# Patient Record
Sex: Female | Born: 1995 | Race: White | Hispanic: No | Marital: Married | Smoking: Current every day smoker
Health system: Southern US, Community
[De-identification: ages and names within clinical notes are randomized; demographics above are authoritative.]

## PROBLEM LIST (undated history)

## (undated) ENCOUNTER — Inpatient Hospital Stay (HOSPITAL_COMMUNITY): Payer: Self-pay

## (undated) DIAGNOSIS — M419 Scoliosis, unspecified: Secondary | ICD-10-CM

## (undated) DIAGNOSIS — F32A Depression, unspecified: Secondary | ICD-10-CM

## (undated) DIAGNOSIS — F419 Anxiety disorder, unspecified: Secondary | ICD-10-CM

## (undated) DIAGNOSIS — F329 Major depressive disorder, single episode, unspecified: Secondary | ICD-10-CM

## (undated) HISTORY — DX: Anxiety disorder, unspecified: F41.9

## (undated) HISTORY — DX: Depression, unspecified: F32.A

## (undated) HISTORY — PX: WISDOM TOOTH EXTRACTION: SHX21

## (undated) HISTORY — PX: TONSILLECTOMY: SUR1361

---
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
## 2015-07-25 ENCOUNTER — Encounter (HOSPITAL_BASED_OUTPATIENT_CLINIC_OR_DEPARTMENT_OTHER): Payer: Self-pay | Admitting: *Deleted

## 2015-07-25 ENCOUNTER — Emergency Department (HOSPITAL_BASED_OUTPATIENT_CLINIC_OR_DEPARTMENT_OTHER): Payer: Medicaid Other

## 2015-07-25 ENCOUNTER — Emergency Department (HOSPITAL_BASED_OUTPATIENT_CLINIC_OR_DEPARTMENT_OTHER)
Admission: EM | Admit: 2015-07-25 | Discharge: 2015-07-25 | Disposition: A | Discharge status: Home / Self Care | Payer: Medicaid Other | Attending: Emergency Medicine | Admitting: Emergency Medicine

## 2015-07-25 DIAGNOSIS — Z79899 Other long term (current) drug therapy: Secondary | ICD-10-CM | POA: Insufficient documentation

## 2015-07-25 DIAGNOSIS — R109 Unspecified abdominal pain: Secondary | ICD-10-CM

## 2015-07-25 DIAGNOSIS — T148XXA Other injury of unspecified body region, initial encounter: Secondary | ICD-10-CM

## 2015-07-25 DIAGNOSIS — O9A212 Injury, poisoning and certain other consequences of external causes complicating pregnancy, second trimester: Secondary | ICD-10-CM | POA: Insufficient documentation

## 2015-07-25 DIAGNOSIS — Z3A17 17 weeks gestation of pregnancy: Secondary | ICD-10-CM | POA: Insufficient documentation

## 2015-07-25 DIAGNOSIS — Y9389 Activity, other specified: Secondary | ICD-10-CM | POA: Diagnosis not present

## 2015-07-25 DIAGNOSIS — W548XXA Other contact with dog, initial encounter: Secondary | ICD-10-CM | POA: Diagnosis not present

## 2015-07-25 DIAGNOSIS — Z8739 Personal history of other diseases of the musculoskeletal system and connective tissue: Secondary | ICD-10-CM | POA: Insufficient documentation

## 2015-07-25 DIAGNOSIS — T148 Other injury of unspecified body region: Secondary | ICD-10-CM | POA: Diagnosis not present

## 2015-07-25 DIAGNOSIS — Y9289 Other specified places as the place of occurrence of the external cause: Secondary | ICD-10-CM | POA: Insufficient documentation

## 2015-07-25 DIAGNOSIS — S3991XA Unspecified injury of abdomen, initial encounter: Secondary | ICD-10-CM | POA: Diagnosis not present

## 2015-07-25 DIAGNOSIS — Y998 Other external cause status: Secondary | ICD-10-CM | POA: Insufficient documentation

## 2015-07-25 DIAGNOSIS — Z87891 Personal history of nicotine dependence: Secondary | ICD-10-CM | POA: Diagnosis not present

## 2015-07-25 DIAGNOSIS — Z3490 Encounter for supervision of normal pregnancy, unspecified, unspecified trimester: Secondary | ICD-10-CM

## 2015-07-25 HISTORY — DX: Scoliosis, unspecified: M41.9

## 2015-07-25 LAB — URINALYSIS, ROUTINE W REFLEX MICROSCOPIC
BILIRUBIN URINE: NEGATIVE
Glucose, UA: NEGATIVE mg/dL
HGB URINE DIPSTICK: NEGATIVE
KETONES UR: NEGATIVE mg/dL
Leukocytes, UA: NEGATIVE
Nitrite: NEGATIVE
PH: 7.5 (ref 5.0–8.0)
Protein, ur: NEGATIVE mg/dL
SPECIFIC GRAVITY, URINE: 1.012 (ref 1.005–1.030)
Urobilinogen, UA: 0.2 mg/dL (ref 0.0–1.0)

## 2015-07-25 LAB — COMPREHENSIVE METABOLIC PANEL WITH GFR
ALT: 12 U/L — ABNORMAL LOW (ref 14–54)
AST: 22 U/L (ref 15–41)
Albumin: 3.7 g/dL (ref 3.5–5.0)
Alkaline Phosphatase: 38 U/L (ref 38–126)
Anion gap: 4 — ABNORMAL LOW (ref 5–15)
BUN: 8 mg/dL (ref 6–20)
CO2: 24 mmol/L (ref 22–32)
Calcium: 8.8 mg/dL — ABNORMAL LOW (ref 8.9–10.3)
Chloride: 108 mmol/L (ref 101–111)
Creatinine, Ser: 0.58 mg/dL (ref 0.44–1.00)
GFR calc Af Amer: >60 mL/min
GFR calc non Af Amer: >60 mL/min
Glucose, Bld: 88 mg/dL (ref 65–99)
Potassium: 3.6 mmol/L (ref 3.5–5.1)
Sodium: 136 mmol/L (ref 135–145)
Total Bilirubin: 0.5 mg/dL (ref 0.3–1.2)
Total Protein: 6.2 g/dL — ABNORMAL LOW (ref 6.5–8.1)

## 2015-07-25 LAB — CBC WITH DIFFERENTIAL/PLATELET
Basophils Absolute: 0 K/uL (ref 0.0–0.1)
Basophils Relative: 0 %
Eosinophils Absolute: 0.1 K/uL (ref 0.0–0.7)
Eosinophils Relative: 1 %
HCT: 37.1 % (ref 36.0–46.0)
Hemoglobin: 12.8 g/dL (ref 12.0–15.0)
Lymphocytes Relative: 20 %
Lymphs Abs: 2 K/uL (ref 0.7–4.0)
MCH: 32.1 pg (ref 26.0–34.0)
MCHC: 34.5 g/dL (ref 30.0–36.0)
MCV: 93 fL (ref 78.0–100.0)
Monocytes Absolute: 0.6 K/uL (ref 0.1–1.0)
Monocytes Relative: 6 %
Neutro Abs: 7.3 K/uL (ref 1.7–7.7)
Neutrophils Relative %: 73 %
Platelets: 177 K/uL (ref 150–400)
RBC: 3.99 MIL/uL (ref 3.87–5.11)
RDW: 12.4 % (ref 11.5–15.5)
WBC: 9.8 K/uL (ref 4.0–10.5)

## 2015-07-25 LAB — ABO/RH: ABO/RH(D): A POS

## 2015-07-25 LAB — HCG, QUANTITATIVE, PREGNANCY: hCG, Beta Chain, Quant, S: 52343 m[IU]/mL — ABNORMAL HIGH

## 2015-07-25 NOTE — Discharge Instructions (Signed)
Before [facility] Before your baby arrives it is important to:  Have all of the supplies that you will need to care for your baby.  Know where to go if there is an emergency.  Discuss the baby's arrival with other family members. WHAT SUPPLIES WILL I NEED? It is recommended that you have the following supplies: Large Items  Crib.  Crib mattress.  Rear-facing infant car seat. If possible, have a trained professional check to make sure that it is installed correctly. Feeding  6-8 bottles that are 4-5 oz in size.  6-8 nipples.  Bottle brush.  Sterilizer, or a large pan or kettle with a lid.  A way to boil and cool water.  If you will be breastfeeding:  Breast pump.  Nipple cream.  Nursing bra.  Breast pads.  Breast shields.  If you will be formula feeding:  Formula.  Measuring cups.  Measuring spoons. Bathing  Mild baby soap and baby shampoo.  Petroleum jelly.  Soft cloth towel and washcloth.  Hooded towel.  Cotton balls.  Bath basin. Other Supplies  Rectal thermometer.  Bulb syringe.  Baby wipes or washcloths for diaper changes.  Diaper bag.  Changing pad.  Clothing, including one-piece outfits and pajamas.  Baby nail clippers.  Receiving blankets.  Mattress pad and sheets for the crib.  Night-light for the baby's room.  Baby monitor.  2 or 3 pacifiers.  Either 24-36 cloth diapers and waterproof diaper covers or a box of disposable diapers. You may need to use as many as 10-12 diapers per day. HOW DO I PREPARE FOR AN EMERGENCY? Prepare for an emergency by:  Knowing how to get to the nearest hospital.  Listing the phone numbers of your baby's health care providers near your home phone and in your cell phone. HOW DO I PREPARE MY FAMILY?  Decide how to handle visitors.  If you have other children:  Talk with them about the baby coming home. Ask them how they feel about it.  Read a book together about being a new big  brother or sister.  Find ways to let them help you prepare for the new baby.  Have someone ready to care for them while you are in the hospital.   This information is not intended to replace advice given to you by your health care provider. Make sure you discuss any questions you have with your health care provider.   Document Released: 09/01/2008 Document Revised: 02/03/2015 Document Reviewed: 08/27/2014 Elsevier Interactive Patient Education 2016 [place] for Pregnant Women While you are pregnant, your body will require additional nutrition to help support your growing baby. It is recommended that you consume:  150 additional calories each day during your first trimester.  300 additional calories each day during your second trimester.  300 additional calories each day during your third trimester. Eating a healthy, well-balanced diet is very important for your health and for your baby's health. You also have a higher need for some vitamins and minerals, such as folic acid, calcium, iron, and vitamin D. WHAT DO I NEED TO KNOW ABOUT EATING DURING PREGNANCY?  Do not try to lose weight or go on a diet during pregnancy.  Choose healthy, nutritious foods. Choose  of a sandwich with a glass of milk instead of a candy bar or a high-calorie sugar-sweetened beverage.  Limit your overall intake of foods that have "empty calories." These are foods that have little nutritional value, such as sweets, desserts,  candies, sugar-sweetened beverages, and fried foods.  Eat a variety of foods, especially fruits and vegetables.  Take a prenatal vitamin to help meet the additional needs during pregnancy, specifically for folic acid, iron, calcium, and vitamin D.  Remember to stay active. Ask your health care provider for exercise recommendations that are specific to you.  Practice good food safety and cleanliness, such as washing your hands before you eat and after you prepare raw  meat. This helps to prevent foodborne illnesses, such as listeriosis, that can be very dangerous for your baby. Ask your health care provider for more information about listeriosis. WHAT DOES 150 EXTRA CALORIES LOOK LIKE? Healthy options for an additional 150 calories each day could be any of the following:  Plain low-fat yogurt (6-8 oz) with  cup of berries.  1 apple with 2 teaspoons of peanut butter.  Cut-up vegetables with  cup of hummus.  Low-fat chocolate milk (8 oz or 1 cup).  1 string cheese with 1 medium orange.   of a peanut butter and jelly sandwich on whole-wheat bread (1 tsp of peanut butter). For 300 calories, you could eat two of those healthy options each day.  WHAT IS A HEALTHY AMOUNT OF WEIGHT TO GAIN? The recommended amount of weight for you to gain is based on your pre-pregnancy BMI. If your pre-pregnancy BMI was:  Less than 18 (underweight), you should gain 28-40 lb.  18-24.9 (normal), you should gain 25-35 lb.  25-29.9 (overweight), you should gain 15-25 lb.  Greater than 30 (obese), you should gain 11-20 lb. WHAT IF I AM HAVING TWINS OR MULTIPLES? Generally, pregnant women who will be having twins or multiples may need to increase their daily calories by 300-600 calories each day. The recommended range for total weight gain is 25-54 lb, depending on your pre-pregnancy BMI. Talk with your health care provider for specific guidance about additional nutritional needs, weight gain, and exercise during your pregnancy. WHAT FOODS CAN I EAT? Grains Any grains. Try to choose whole grains, such as whole-wheat bread, oatmeal, or brown rice. Vegetables Any vegetables. Try to eat a variety of colors and types of vegetables to get a full range of vitamins and minerals. Remember to wash your vegetables well before eating. Fruits Any fruits. Try to eat a variety of colors and types of fruit to get a full range of vitamins and minerals. Remember to wash your fruits well  before eating. Meats and Other Protein Sources Lean meats, including chicken, [place], fish, and lean cuts of beef, veal, or pork. Make sure that all meats are cooked to "well done." Tofu. Tempeh. Beans. Eggs. Peanut butter and other nut butters. Seafood, such as shrimp, crab, and lobster. If you choose fish, select types that are higher in omega-3 fatty acids, including salmon, herring, mussels, trout, sardines, and pollock. Make sure that all meats are cooked to food-safe temperatures. Dairy Pasteurized milk and milk alternatives. Pasteurized yogurt and pasteurized cheese. Cottage cheese. Sour cream. Beverages Water. Juices that contain 100% fruit juice or vegetable juice. Caffeine-free teas and decaffeinated coffee. Drinks that contain caffeine are okay to drink, but it is better to avoid caffeine. Keep your total caffeine intake to less than 200 mg each day (12 oz of coffee, tea, or soda) or as directed by your health care provider. Condiments Any pasteurized condiments. Sweets and Desserts Any sweets and desserts. Fats and Oils Any fats and oils. The items listed above may not be a complete list of recommended foods or beverages.  Contact your dietitian for more options. WHAT FOODS ARE NOT RECOMMENDED? Vegetables Unpasteurized (raw) vegetable juices. Fruits Unpasteurized (raw) fruit juices. Meats and Other Protein Sources Cured meats that have nitrates, such as bacon, salami, and hotdogs. Luncheon meats, bologna, or other deli meats (unless they are reheated until they are steaming hot). Refrigerated pate, meat spreads from a meat counter, smoked seafood that is found in the refrigerated section of a store. Raw fish, such as sushi or sashimi. High mercury content fish, such as tilefish, shark, swordfish, and king mackerel. Raw meats, such as tuna or beef tartare. Undercooked meats and poultry. Make sure that all meats are cooked to food-safe temperatures. Dairy Unpasteurized (raw) milk and  any foods that have raw milk in them. Soft cheeses, such as feta, queso blanco, queso fresco, Brie, Camembert cheeses, blue-veined cheeses, and Panela cheese (unless it is made with pasteurized milk, which must be stated on the label). Beverages Alcohol. Sugar-sweetened beverages, such as sodas, teas, or energy drinks. Condiments Homemade fermented foods and drinks, such as pickles, sauerkraut, or kombucha drinks. (Store-bought pasteurized versions of these are okay.) Other Salads that are made in the store, such as ham salad, chicken salad, egg salad, tuna salad, and seafood salad. The items listed above may not be a complete list of foods and beverages to avoid. Contact your dietitian for more information.   This information is not intended to replace advice given to you by your health care provider. Make sure you discuss any questions you have with your health care provider.   Document Released: 07/04/2014 Document Reviewed: 07/04/2014 Elsevier Interactive Patient Education 2016 [place] of Pregnancy The second trimester is from week 13 through week 28, months 4 through 6. The second trimester is often a time when you feel your best. Your body has also adjusted to being pregnant, and you begin to feel better physically. Usually, morning sickness has lessened or quit completely, you may have more energy, and you may have an increase in appetite. The second trimester is also a time when the fetus is growing rapidly. At the end of the sixth month, the fetus is about 9 inches long and weighs about 1 pounds. You will likely begin to feel the baby move (quickening) between 18 and 20 weeks of the pregnancy. BODY CHANGES Your body goes through many changes during pregnancy. The changes vary from woman to woman.   Your weight will continue to increase. You will notice your lower abdomen bulging out.  You may begin to get stretch marks on your hips, abdomen, and breasts.  You  may develop headaches that can be relieved by medicines approved by your health care provider.  You may urinate more often because the fetus is pressing on your bladder.  You may develop or continue to have heartburn as a result of your pregnancy.  You may develop constipation because certain hormones are causing the muscles that push waste through your intestines to slow down.  You may develop hemorrhoids or swollen, bulging veins (varicose veins).  You may have back pain because of the weight gain and pregnancy hormones relaxing your joints between the bones in your pelvis and as a result of a shift in weight and the muscles that support your balance.  Your breasts will continue to grow and be tender.  Your gums may bleed and may be sensitive to brushing and flossing.  Dark spots or blotches (chloasma, mask of pregnancy) may develop on your face. This will  likely fade after the baby is born.  A dark line from your belly button to the pubic area (linea nigra) may appear. This will likely fade after the baby is born.  You may have changes in your hair. These can include thickening of your hair, rapid growth, and changes in texture. Some women also have hair loss during or after pregnancy, or hair that feels dry or thin. Your hair will most likely return to normal after your baby is born. WHAT TO EXPECT AT YOUR PRENATAL VISITS During a routine prenatal visit:  You will be weighed to make sure you and the fetus are growing normally.  Your blood pressure will be taken.  Your abdomen will be measured to track your baby's growth.  The fetal heartbeat will be listened to.  Any test results from the previous visit will be discussed. Your health care provider may ask you:  How you are feeling.  If you are feeling the baby move.  If you have had any abnormal symptoms, such as leaking fluid, bleeding, severe headaches, or abdominal cramping.  If you are using any tobacco products,  including cigarettes, chewing tobacco, and electronic cigarettes.  If you have any questions. Other tests that may be performed during your second trimester include:  Blood tests that check for:  Low iron levels (anemia).  Gestational diabetes (between 24 and 28 weeks).  Rh antibodies.  Urine tests to check for infections, diabetes, or protein in the urine.  An ultrasound to confirm the proper growth and development of the baby.  An amniocentesis to check for possible genetic problems.  Fetal screens for spina bifida and Down syndrome.  HIV (human immunodeficiency virus) testing. Routine prenatal testing includes screening for HIV, unless you choose not to have this test. HOME CARE INSTRUCTIONS   Avoid all smoking, herbs, alcohol, and unprescribed drugs. These chemicals affect the formation and growth of the baby.  Do not use any tobacco products, including cigarettes, chewing tobacco, and electronic cigarettes. If you need help quitting, ask your health care provider. You may receive counseling support and other resources to help you quit.  Follow your health care provider's instructions regarding medicine use. There are medicines that are either safe or unsafe to take during pregnancy.  Exercise only as directed by your health care provider. Experiencing uterine cramps is a good sign to stop exercising.  Continue to eat regular, healthy meals.  Wear a good support bra for breast tenderness.  Do not use hot tubs, steam rooms, or saunas.  Wear your seat belt at all times when driving.  Avoid raw meat, uncooked cheese, cat litter boxes, and soil used by cats. These carry germs that can cause birth defects in the baby.  Take your prenatal vitamins.  Take 1500-2000 mg of calcium daily starting at the 20th week of pregnancy until you deliver your baby.  Try taking a stool softener (if your health care provider approves) if you develop constipation. Eat more high-fiber foods,  such as fresh vegetables or fruit and whole grains. Drink plenty of fluids to keep your urine clear or pale yellow.  Take warm sitz baths to soothe any pain or discomfort caused by hemorrhoids. Use hemorrhoid cream if your health care provider approves.  If you develop varicose veins, wear support hose. Elevate your feet for 15 minutes, 3-4 times a day. Limit salt in your diet.  Avoid heavy lifting, wear low heel shoes, and practice good posture.  Rest with your legs elevated if  you have leg cramps or low back pain.  Visit your dentist if you have not gone yet during your pregnancy. Use a soft toothbrush to brush your teeth and be gentle when you floss.  A sexual relationship may be continued unless your health care provider directs you otherwise.  Continue to go to all your prenatal visits as directed by your health care provider. SEEK MEDICAL CARE IF:   You have dizziness.  You have mild pelvic cramps, pelvic pressure, or nagging pain in the abdominal area.  You have persistent nausea, vomiting, or diarrhea.  You have a bad smelling vaginal discharge.  You have pain with urination. SEEK IMMEDIATE MEDICAL CARE IF:   You have a fever.  You are leaking fluid from your vagina.  You have spotting or bleeding from your vagina.  You have severe abdominal cramping or pain.  You have rapid weight gain or loss.  You have shortness of breath with chest pain.  You notice sudden or extreme swelling of your face, hands, ankles, feet, or legs.  You have not felt your baby move in over an hour.  You have severe headaches that do not go away with medicine.  You have vision changes.   This information is not intended to replace advice given to you by your health care provider. Make sure you discuss any questions you have with your health care provider.   Document Released: 09/13/2001 Document Revised: 10/10/2014 Document Reviewed: 11/20/2012 Elsevier Interactive Patient Education  2016 [employer].  Prenatal Care WHAT IS PRENATAL CARE?  Prenatal care is the process of caring for a pregnant woman before she gives birth. Prenatal care makes sure that she and her baby remain as healthy as possible throughout pregnancy. Prenatal care may be provided by a midwife, family practice health care provider, or a childbirth and pregnancy specialist (obstetrician). Prenatal care may include physical examinations, testing, treatments, and education on nutrition, lifestyle, and social support services. WHY IS PRENATAL CARE SO IMPORTANT?  Early and consistent prenatal care increases the chance that you and your baby will remain healthy throughout your pregnancy. This type of care also decreases a baby's risk of being born too early (prematurely), or being born smaller than expected (small for gestational age). Any underlying medical conditions you may have that could pose a risk during your pregnancy are discussed during prenatal care visits. You will also be monitored regularly for any new conditions that may arise during your pregnancy so they can be treated quickly and effectively. WHAT HAPPENS DURING PRENATAL CARE VISITS? Prenatal care visits may include the following: Discussion Tell your health care provider about any new signs or symptoms you have experienced since your last visit. These might include:  Nausea or vomiting.  Increased or decreased level of energy.  Difficulty sleeping.  Back or leg pain.  Weight changes.  Frequent urination.  Shortness of breath with physical activity.  Changes in your skin, such as the development of a rash or itchiness.  Vaginal discharge or bleeding.  Feelings of excitement or nervousness.  Changes in your baby's movements. You may want to write down any questions or topics you want to discuss with your health care provider and bring them with you to your appointment. Examination During your first prenatal care visit, you will  likely have a complete physical exam. Your health care provider will often examine your vagina, cervix, and the position of your uterus, as well as check your heart, lungs, and other body systems. As  your pregnancy progresses, your health care provider will measure the size of your uterus and your baby's position inside your uterus. He or she may also examine you for early signs of labor. Your prenatal visits may also include checking your blood pressure and, after about 10-12 weeks of pregnancy, listening to your baby's heartbeat. Testing Regular testing often includes:  Urinalysis. This checks your urine for glucose, protein, or signs of infection.  Blood count. This checks the levels of white and red blood cells in your body.  Tests for sexually transmitted infections (STIs). Testing for STIs at the beginning of pregnancy is routinely done and is required in many states.  Antibody testing. You will be checked to see if you are immune to certain illnesses, such as rubella, that can affect a developing fetus.  Glucose screen. Around 24-28 weeks of pregnancy, your blood glucose level will be checked for signs of gestational diabetes. Follow-up tests may be recommended.  Group B strep. This is a bacteria that is commonly found inside a woman's vagina. This test will inform your health care provider if you need an antibiotic to reduce the amount of this bacteria in your body prior to labor and childbirth.  Ultrasound. Many pregnant women undergo an ultrasound screening around 18-20 weeks of pregnancy to evaluate the health of the fetus and check for any developmental abnormalities.  HIV (human immunodeficiency virus) testing. Early in your pregnancy, you will be screened for HIV. If you are at high risk for HIV, this test may be repeated during your third trimester of pregnancy. You may be offered other testing based on your age, personal or family medical history, or other factors.  HOW OFTEN  SHOULD I PLAN TO SEE MY HEALTH CARE PROVIDER FOR PRENATAL CARE? Your prenatal care check-up schedule depends on any medical conditions you have before, or develop during, your pregnancy. If you do not have any underlying medical conditions, you will likely be seen for checkups:  Monthly, during the first 6 months of pregnancy.  Twice a month during months 7 and 8 of pregnancy.  Weekly starting in the 9th month of pregnancy and until delivery. If you develop signs of early labor or other concerning signs or symptoms, you may need to see your health care provider more often. Ask your health care provider what prenatal care schedule is best for you. WHAT CAN I DO TO KEEP MYSELF AND MY BABY AS HEALTHY AS POSSIBLE DURING MY PREGNANCY?  Take a prenatal vitamin containing 400 micrograms (0.4 mg) of folic acid every day. Your health care provider may also ask you to take additional vitamins such as iodine, vitamin D, iron, copper, and zinc.  Take 1500-2000 mg of calcium daily starting at your 20th week of pregnancy until you deliver your baby.  Make sure you are up to date on your vaccinations. Unless directed otherwise by your health care provider:  You should receive a tetanus, diphtheria, and pertussis (Tdap) vaccination between the 27th and 36th week of your pregnancy, regardless of when your last Tdap immunization occurred. This helps protect your baby from whooping cough (pertussis) after he or she is born.  You should receive an annual inactivated influenza vaccine (IIV) to help protect you and your baby from influenza. This can be done at any point during your pregnancy.  Eat a well-rounded diet that includes:  Fresh fruits and vegetables.  Lean proteins.  Calcium-rich foods such as milk, yogurt, hard cheeses, and dark, leafy greens.  Whole grain  breads.  Do noteat seafood high in mercury, including:  Swordfish.  Tilefish.  Shark.  King mackerel.  More than 6 oz tuna per  week.  Do not eat:  Raw or undercooked meats or eggs.  Unpasteurized foods, such as soft cheeses (brie, blue, or feta), juices, and milks.  Lunch meats.  Hot dogs that have not been heated until they are steaming.  Drink enough water to keep your urine clear or pale yellow. For many women, this may be 10 or more 8 oz glasses of water each day. Keeping yourself hydrated helps deliver nutrients to your baby and may prevent the start of pre-term uterine contractions.  Do not use any tobacco products including cigarettes, chewing tobacco, or electronic cigarettes. If you need help quitting, ask your health care provider.  Do not drink beverages containing alcohol. No safe level of alcohol consumption during pregnancy has been determined.  Do not use any illegal drugs. These can harm your developing baby or cause a miscarriage.  Ask your health care provider or pharmacist before taking any prescription or over-the-counter medicines, herbs, or supplements.  Limit your caffeine intake to no more than 200 mg per day.  Exercise. Unless told otherwise by your health care provider, try to get 30 minutes of moderate exercise most days of the week. Do not  do high-impact activities, contact sports, or activities with a high risk of falling, such as horseback riding or downhill skiing.  Get plenty of rest.  Avoid anything that raises your body temperature, such as hot tubs and saunas.  If you own a cat, do not empty its litter box. Bacteria contained in cat feces can cause an infection called toxoplasmosis. This can result in serious harm to the fetus.  Stay away from chemicals such as insecticides, lead, mercury, and cleaning or paint products that contain solvents.  Do not have any X-rays taken unless medically necessary.  Take a childbirth and breastfeeding preparation class. Ask your health care provider if you need a referral or recommendation.   This information is not intended to  replace advice given to you by your health care provider. Make sure you discuss any questions you have with your health care provider.  FOLLOW UP WITH OBGYN AS SOON AS POSSIBLE FOR PRENATAL CARE AND EVALUATION. AVOID USE OF PHENERGAN IN PREGNANCY. READ ABOVE INFORMATION FOR PRENATAL EDUCATION. RETURN TO THE EMERGENCY DEPARTMENT IF YOU EXPERIENCE WORSENING OF YOUR SYMPTOMS, VAGINAL BLEEDING, WORSENING PAIN, FEVER, CHEST PAIN, SHORTNESS OF BREATH.

## 2015-07-25 NOTE — ED Notes (Addendum)
Patient c/o L side abd pain since their dog jumped on her on Thursday, no bleeding. Pregnant, she took a pregnancy test July 27 and it was positive

## 2015-07-25 NOTE — ED Provider Notes (Signed)
CSN: [id number]     Arrival date & time 07/25/15  1108 History   First MD Initiated Contact with Patient 07/25/15 1210     Chief Complaint  Patient presents with  . Abdominal Pain     (Consider location/radiation/quality/duration/timing/severity/associated sxs/prior Treatment) HPI   Emily Villanueva is a 19 y.o F G1P0 who presents to the emergency department today complaining of abdominal pain. Patient states that her 40 pound dog jumped on her stomach 3 days ago and she has had pain in left side of her abdomen ever since. Pain is 5/10. No aggravating or alleviating factors. Pain is constant. Patient states that she had a positive pregnancy test on July 27. She has not seen OB/GYN or had any prenatal care. Denies vomiting, vaginal discharge, dysuria, hematuria, vaginal bleeding. Patient is having regular bowel movements. No fever.  Past Medical History  Diagnosis Date  . Scoliosis    Past Surgical History  Procedure Laterality Date  . Tonsillectomy     No family history on file. Social History  Substance Use Topics  . Smoking status: Former Research scientist (life sciences)  . Smokeless tobacco: None  . Alcohol Use: No   OB History    No data available     Review of Systems  All other systems reviewed and are negative.     Allergies  Review of patient's allergies indicates no known allergies.  Home Medications   Prior to Admission medications   Medication Sig Start Date End Date Taking? Authorizing Provider  Prenatal Vit-Fe Fumarate-FA (PRENATAL MULTIVITAMIN) TABS tablet Take 1 tablet by mouth daily at 12 noon.   Yes Historical Provider, MD   BP 123/68 mmHg  Pulse 79  Temp(Src) 98.4 F (36.9 C) (Oral)  Resp 18  Ht 5\' 10"  (1.778 m)  Wt 180 lb 11.2 oz (81.965 kg)  BMI 25.93 kg/m2  SpO2 100%  LMP 04/06/2015 Physical Exam  Constitutional: She is oriented to person, place, and time. She appears well-developed and well-nourished. No distress.  HENT:  Head: Normocephalic and atraumatic.   Mouth/Throat: No oropharyngeal exudate.  Eyes: Conjunctivae and EOM are normal. Pupils are equal, round, and reactive to light. Right eye exhibits no discharge. Left eye exhibits no discharge. No scleral icterus.  Neck: Normal range of motion. Neck supple.  Cardiovascular: Normal rate, regular rhythm, normal heart sounds and intact distal pulses.  Exam reveals no gallop and no friction rub.   No murmur heard. Pulmonary/Chest: Effort normal and breath sounds normal. No respiratory distress. She has no wheezes. She has no rales. She exhibits no tenderness.  Abdominal: Soft. Bowel sounds are normal. She exhibits no distension and no mass. There is tenderness ( Mild tenderness to palpation of left upper quadrant.). There is no guarding.  No ecchymosis or edema.  Genitourinary: No vaginal discharge found.  Musculoskeletal: Normal range of motion. She exhibits no edema.  Lymphadenopathy:    She has no cervical adenopathy.  Neurological: She is alert and oriented to person, place, and time. No cranial nerve deficit.  Strength 5/5 throughout. No sensory deficits.  No gait abnormality.  Skin: Skin is warm and dry. No rash noted. She is not diaphoretic. No erythema. No pallor.  Psychiatric: She has a normal mood and affect. Her behavior is normal.  Nursing note and vitals reviewed.   ED Course  Procedures (including critical care time) Labs Review Labs Reviewed  COMPREHENSIVE METABOLIC PANEL - Abnormal; Notable for the following:    Calcium 8.8 (*)    Total Protein 6.2 (*)  ALT 12 (*)    Anion gap 4 (*)    All other components within normal limits  HCG, QUANTITATIVE, PREGNANCY - Abnormal; Notable for the following:    hCG, Beta Chain, Quant, [place] [address] (*)    All other components within normal limits  URINALYSIS, ROUTINE W REFLEX MICROSCOPIC (NOT AT [facility])  CBC WITH DIFFERENTIAL/PLATELET  ABO/RH    Imaging Review US Ob Limited  07/25/2015  CLINICAL DATA:  Dog jumped forcefully onto  abdomen 3 days ago, waxing and waning LEFT periumbilical pain since, former smoker, smoked during first 3 weeks of pregnancy ; estimated gestational age of [redacted] weeks 0 days by LMP EXAM: LIMITED OBSTETRIC ULTRASOUND FINDINGS: Number of Fetuses: 1 Heart Rate:  162 bpm Movement: Yes Presentation: Variable Placental Location: Fundal and RIGHT lateral Previa: No Amniotic Fluid (Subjective):  Within normal limits. BPD:  3.71 cm     17 w 2 d       [place] EGA:  12/31/2015 MATERNAL FINDINGS: Cervix:  Appears closed. Uterus/Adnexae:  No abnormality visualized. IMPRESSION: Single live intrauterine gestation as above. No acute abnormalities identified. This exam is performed on an emergent basis and does not comprehensively evaluate fetal size, dating, or anatomy; follow-up complete non emergent OB US recommended as patient indicates this has not been performed. Electronically Signed   By: Lavonia Dana M.D.   On: 07/25/2015 13:28   I have personally reviewed and evaluated these images and lab results as part of my medical decision-making.   EKG Interpretation None      MDM   Final diagnoses:  Contusion  Pregnancy    Patient presents with left-sided abdominal pain after her large dog jumped on her 3 days ago. Pain is improving. Patient is also pregnant. Patient has not had any prenatal care. LMP Was end of July. No vaginal bleeding or vaginal discharge. No dysuria. This is patient's first pregnancy.   US reveals single living IUP. All other labs within normal limits. Abdominal exam otherwise benign. Afebrile, in NAD. Do not suspect other acute abdominal process. Pt does not want STD testing at this time. Pt with one sexual partner who accompanies her to the visit. UA neg for infection. Pt is A pos. Does not need RhoGam. Hcg level appropriate for gestational age.   Recommend URGENT follow up with OBGYN as pt is [redacted] weeks pregnant and has not had any prenatal care. Discussed the importance of this with the patient who  is agreeable. Will discharge home with OBGYN referral. Pt given STRICT return precautions.       Dondra Spry [place], PA-C 07/26/15 1287  Serita Grit, MD 07/26/15 [phone]

## 2015-08-05 ENCOUNTER — Ambulatory Visit (INDEPENDENT_AMBULATORY_CARE_PROVIDER_SITE_OTHER): Payer: Medicaid Other | Admitting: Family Medicine

## 2015-08-05 ENCOUNTER — Encounter: Payer: Self-pay | Admitting: Family Medicine

## 2015-08-05 ENCOUNTER — Other Ambulatory Visit (HOSPITAL_COMMUNITY)
Admission: RE | Admit: 2015-08-05 | Discharge: 2015-08-05 | Disposition: A | Discharge status: Home / Self Care | Payer: Medicaid Other | Source: Ambulatory Visit | Attending: Family Medicine | Admitting: Family Medicine

## 2015-08-05 VITALS — BP 104/56 | HR 87 | Wt 181.0 lb

## 2015-08-05 DIAGNOSIS — Z3402 Encounter for supervision of normal first pregnancy, second trimester: Secondary | ICD-10-CM

## 2015-08-05 DIAGNOSIS — Z113 Encounter for screening for infections with a predominantly sexual mode of transmission: Secondary | ICD-10-CM | POA: Insufficient documentation

## 2015-08-05 DIAGNOSIS — Z3492 Encounter for supervision of normal pregnancy, unspecified, second trimester: Secondary | ICD-10-CM

## 2015-08-05 DIAGNOSIS — Z3400 Encounter for supervision of normal first pregnancy, unspecified trimester: Secondary | ICD-10-CM | POA: Insufficient documentation

## 2015-08-05 DIAGNOSIS — B360 Pityriasis versicolor: Secondary | ICD-10-CM

## 2015-08-05 LAB — OB RESULTS CONSOLE GC/CHLAMYDIA: GC PROBE AMP, GENITAL: NEGATIVE

## 2015-08-05 NOTE — Progress Notes (Signed)
Patient late to prenatal care. Unsure of LMP but did have some spotting 04-06-15 to 04-08-15. Bedside ultrasound done and fetal heart rate 160. Femur length done and 2.39cm giving gestational age 20.1 weeks.

## 2015-08-05 NOTE — Patient Instructions (Addendum)
Tinea Versicolor Tinea versicolor is a common fungal infection of the skin. It causes a rash that appears as light or dark patches on the skin. The rash most often occurs on the chest, back, neck, or upper arms. This condition is more common during warm weather. Other than affecting how your skin looks, tinea versicolor usually does not cause other problems. In most cases, the infection goes away in a few weeks with treatment. It may take a few months for the patches on your skin to clear up. CAUSES Tinea versicolor occurs when a type of fungus that is normally present on the skin starts to overgrow. This fungus is a kind of yeast. The exact cause of the overgrowth is not known. This condition cannot be passed from one person to another (noncontagious). RISK FACTORS This condition is more likely to develop when certain factors are present, such as:  Heat and humidity.  Sweating too much.  Hormone changes.  Oily skin.  A weak defense (immune) system. SYMPTOMS Symptoms of this condition may include:  A rash on your skin that is made up of light or dark patches. The rash may have:  Patches of tan or pink spots on light skin.  Patches of white or brown spots on dark skin.  Patches of skin that do not tan.  Well-marked edges.  Scales on the discolored areas.  Mild itching. DIAGNOSIS A health care provider can usually diagnose this condition by looking at your skin. During the exam, he or she may use ultraviolet light to help determine the extent of the infection. In some cases, a skin sample may be taken by scraping the rash. This sample will be viewed under a microscope to check for yeast overgrowth. TREATMENT Treatment for this condition may include:  Dandruff shampoo that is applied to the affected skin during showers or bathing.  (Selsun Blue Shampoo works well)  Over-the-counter medicated skin cream, lotion, or soaps.  Prescription antifungal medicine in the form of skin  cream or pills.  Medicine to help reduce itching. HOME CARE INSTRUCTIONS  Take medicines only as directed by your health care provider.  Apply dandruff shampoo to the affected area if told to do so by your health care provider. You may be instructed to scrub the affected skin for several minutes each day.  Do not scratch the affected area of skin.  Avoid hot and humid conditions.  Do not use tanning booths.  Try to avoid sweating a lot. SEEK MEDICAL CARE IF:  Your symptoms get worse.  You have a fever.  You have redness, swelling, or pain at the site of your rash.  You have fluid, blood, or pus coming from your rash.  Your rash returns after treatment.   This information is not intended to replace advice given to you by your health care provider. Make sure you discuss any questions you have with your health care provider.   Document Released: 09/16/2000 Document Revised: 10/10/2014 Document Reviewed: 07/01/2014 Elsevier Interactive Patient Education 2016 Mark of Pregnancy The second trimester is from week 13 through week 28, months 4 through 6. The second trimester is often a time when you feel your best. Your body has also adjusted to being pregnant, and you begin to feel better physically. Usually, morning sickness has lessened or quit completely, you may have more energy, and you may have an increase in appetite. The second trimester is also a time when the fetus is growing rapidly. At  the end of the sixth month, the fetus is about 9 inches long and weighs about 1 pounds. You will likely begin to feel the baby move (quickening) between 18 and 20 weeks of the pregnancy. BODY CHANGES Your body goes through many changes during pregnancy. The changes vary from woman to woman.   Your weight will continue to increase. You will notice your lower abdomen bulging out.  You may begin to get stretch marks on your hips, abdomen, and breasts.  You may  develop headaches that can be relieved by medicines approved by your health care provider.  You may urinate more often because the fetus is pressing on your bladder.  You may develop or continue to have heartburn as a result of your pregnancy.  You may develop constipation because certain hormones are causing the muscles that push waste through your intestines to slow down.  You may develop hemorrhoids or swollen, bulging veins (varicose veins).  You may have back pain because of the weight gain and pregnancy hormones relaxing your joints between the bones in your pelvis and as a result of a shift in weight and the muscles that support your balance.  Your breasts will continue to grow and be tender.  Your gums may bleed and may be sensitive to brushing and flossing.  Dark spots or blotches (chloasma, mask of pregnancy) may develop on your face. This will likely fade after the baby is born.  A dark line from your belly button to the pubic area (linea nigra) may appear. This will likely fade after the baby is born.  You may have changes in your hair. These can include thickening of your hair, rapid growth, and changes in texture. Some women also have hair loss during or after pregnancy, or hair that feels dry or thin. Your hair will most likely return to normal after your baby is born. WHAT TO EXPECT AT YOUR PRENATAL VISITS During a routine prenatal visit:  You will be weighed to make sure you and the fetus are growing normally.  Your blood pressure will be taken.  Your abdomen will be measured to track your baby's growth.  The fetal heartbeat will be listened to.  Any test results from the previous visit will be discussed. Your health care provider may ask you:  How you are feeling.  If you are feeling the baby move.  If you have had any abnormal symptoms, such as leaking fluid, bleeding, severe headaches, or abdominal cramping.  If you are using any tobacco products,  including cigarettes, chewing tobacco, and electronic cigarettes.  If you have any questions. Other tests that may be performed during your second trimester include:  Blood tests that check for:  Low iron levels (anemia).  Gestational diabetes (between 24 and 28 weeks).  Rh antibodies.  Urine tests to check for infections, diabetes, or protein in the urine.  An ultrasound to confirm the proper growth and development of the baby.  An amniocentesis to check for possible genetic problems.  Fetal screens for spina bifida and Down syndrome.  HIV (human immunodeficiency virus) testing. Routine prenatal testing includes screening for HIV, unless you choose not to have this test. HOME CARE INSTRUCTIONS   Avoid all smoking, herbs, alcohol, and unprescribed drugs. These chemicals affect the formation and growth of the baby.  Do not use any tobacco products, including cigarettes, chewing tobacco, and electronic cigarettes. If you need help quitting, ask your health care provider. You may receive counseling support and other resources  to help you quit.  Follow your health care provider's instructions regarding medicine use. There are medicines that are either safe or unsafe to take during pregnancy.  Exercise only as directed by your health care provider. Experiencing uterine cramps is a good sign to stop exercising.  Continue to eat regular, healthy meals.  Wear a good support bra for breast tenderness.  Do not use hot tubs, steam rooms, or saunas.  Wear your seat belt at all times when driving.  Avoid raw meat, uncooked cheese, cat litter boxes, and soil used by cats. These carry germs that can cause birth defects in the baby.  Take your prenatal vitamins.  Take 1500-2000 mg of calcium daily starting at the 20th week of pregnancy until you deliver your baby.  Try taking a stool softener (if your health care provider approves) if you develop constipation. Eat more high-fiber foods,  such as fresh vegetables or fruit and whole grains. Drink plenty of fluids to keep your urine clear or pale yellow.  Take warm sitz baths to soothe any pain or discomfort caused by hemorrhoids. Use hemorrhoid cream if your health care provider approves.  If you develop varicose veins, wear support hose. Elevate your feet for 15 minutes, 3-4 times a day. Limit salt in your diet.  Avoid heavy lifting, wear low heel shoes, and practice good posture.  Rest with your legs elevated if you have leg cramps or low back pain.  Visit your dentist if you have not gone yet during your pregnancy. Use a soft toothbrush to brush your teeth and be gentle when you floss.  A sexual relationship may be continued unless your health care provider directs you otherwise.  Continue to go to all your prenatal visits as directed by your health care provider. SEEK MEDICAL CARE IF:   You have dizziness.  You have mild pelvic cramps, pelvic pressure, or nagging pain in the abdominal area.  You have persistent nausea, vomiting, or diarrhea.  You have a bad smelling vaginal discharge.  You have pain with urination. SEEK IMMEDIATE MEDICAL CARE IF:   You have a fever.  You are leaking fluid from your vagina.  You have spotting or bleeding from your vagina.  You have severe abdominal cramping or pain.  You have rapid weight gain or loss.  You have shortness of breath with chest pain.  You notice sudden or extreme swelling of your face, hands, ankles, feet, or legs.  You have not felt your baby move in over an hour.  You have severe headaches that do not go away with medicine.  You have vision changes.   This information is not intended to replace advice given to you by your health care provider. Make sure you discuss any questions you have with your health care provider.   Document Released: 09/13/2001 Document Revised: 10/10/2014 Document Reviewed: 11/20/2012 Elsevier Interactive Patient Education  [employer].

## 2015-08-05 NOTE — Addendum Note (Signed)
Addended by: Phill Myron on: 08/05/2015 04:39 PM   Modules accepted: Orders

## 2015-08-05 NOTE — Progress Notes (Signed)
   Subjective:    Emily Villanueva is a G1P0 [redacted]w[redacted]d being seen today for her first obstetrical visit.  Her obstetrical history is insignificant.  FOB is involved.  This was a planned pregnancy. Patient does intend to breast feed. Pregnancy history fully reviewed.  Patient reports no complaints.  Filed Vitals:   08/05/15 1522  BP: 104/56  Pulse: 87  Weight: 181 lb (82.101 kg)    HISTORY: OB History  Gravida Para Term Preterm AB SAB TAB Ectopic Multiple Living  1             # Outcome Date GA Lbr Len/2nd Weight Sex Delivery Anes PTL Lv  1 Current              Past Medical History  Diagnosis Date  . Scoliosis   . Scoliosis    Past Surgical History  Procedure Laterality Date  . Tonsillectomy     Family History  Problem Relation Age of Onset  . Stroke Mother      Exam    Uterus:     Pelvic Exam:   System:     Skin: Tinea versicolor on chest, shoulders, neck    Neurologic: gait normal; reflexes normal and symmetric   Extremities: normal strength, tone, and muscle mass   HEENT PERRLA and extra ocular movement intact   Mouth/Teeth mucous membranes moist, pharynx normal without lesions   Neck supple and no masses   Cardiovascular: regular rate and rhythm, no murmurs or gallops   Respiratory:  appears well, vitals normal, no respiratory distress, acyanotic, normal RR, ear and throat exam is normal, neck free of mass or lymphadenopathy, chest clear, no wheezing, crepitations, rhonchi, normal symmetric air entry   Abdomen: soft, non-tender; bowel sounds normal; no masses,  no organomegaly          Assessment:    Pregnancy: G1P0 Patient Active Problem List   Diagnosis Date Noted  . Supervision of normal first pregnancy, antepartum 08/05/2015        Plan:     Initial labs drawn. Prenatal vitamins. Problem list reviewed and updated. Genetic Screening discussed Quad Screen: ordered.  Ultrasound discussed; fetal survey: ordered.  Follow up in 4 weeks. 50% of 30  min visit spent on counseling and coordination of care.     Loma Boston JEHIEL 08/05/2015

## 2015-08-06 LAB — AFP, QUAD SCREEN
AFP: 57.7 ng/mL
Age Alone: 1:1170 {titer}
CURR GEST AGE: 17.2 wks.days
Down Syndrome Scr Risk Est: <1:38500 {titer}
HCG, Total: 60.66 IU/mL
INH: 244.5 pg/mL
Interpretation-AFP: NEGATIVE
MOM FOR HCG: 2.19
MoM for AFP: 1.61
MoM for INH: 1.68
Open Spina bifida: NEGATIVE
Osb Risk: 1:2040 {titer}
TRI 18 SCR RISK EST: NEGATIVE
UE3 VALUE: 1.64 ng/mL
uE3 Mom: 1.59

## 2015-08-06 LAB — OBSTETRIC PANEL
Antibody Screen: NEGATIVE
BASOS PCT: 0 % (ref 0–1)
Basophils Absolute: 0 10*3/uL (ref 0.0–0.1)
Eosinophils Absolute: 0.1 10*3/uL (ref 0.0–0.7)
Eosinophils Relative: 1 % (ref 0–5)
HEMATOCRIT: 38.8 % (ref 36.0–46.0)
Hemoglobin: 13.6 g/dL (ref 12.0–15.0)
Hepatitis B Surface Ag: NEGATIVE
LYMPHS ABS: 1.7 10*3/uL (ref 0.7–4.0)
LYMPHS PCT: 18 % (ref 12–46)
MCH: 31.9 pg (ref 26.0–34.0)
MCHC: 35.1 g/dL (ref 30.0–36.0)
MCV: 91.1 fL (ref 78.0–100.0)
MONOS PCT: 6 % (ref 3–12)
MPV: 10.8 fL (ref 8.6–12.4)
Monocytes Absolute: 0.6 10*3/uL (ref 0.1–1.0)
NEUTROS ABS: 7.3 10*3/uL (ref 1.7–7.7)
NEUTROS PCT: 75 % (ref 43–77)
PLATELETS: 197 10*3/uL (ref 150–400)
RBC: 4.26 MIL/uL (ref 3.87–5.11)
RDW: 12.9 % (ref 11.5–15.5)
RUBELLA: 4.37 {index} — AB (ref <0.90)
Rh Type: POSITIVE
WBC: 9.7 10*3/uL (ref 4.0–10.5)

## 2015-08-06 LAB — HIV ANTIBODY (ROUTINE TESTING W REFLEX): HIV 1&2 Ab, 4th Generation: NONREACTIVE

## 2015-08-07 LAB — GC/CHLAMYDIA PROBE AMP (~~LOC~~) NOT AT ARMC
Chlamydia: NEGATIVE
NEISSERIA GONORRHEA: NEGATIVE

## 2015-08-07 LAB — CULTURE, URINE COMPREHENSIVE
COLONY COUNT: NO GROWTH
ORGANISM ID, BACTERIA: NO GROWTH

## 2015-08-11 LAB — CYSTIC FIBROSIS DIAGNOSTIC STUDY

## 2015-08-17 ENCOUNTER — Ambulatory Visit (HOSPITAL_COMMUNITY)
Admission: RE | Admit: 2015-08-17 | Discharge: 2015-08-17 | Disposition: A | Discharge status: Home / Self Care | Payer: Medicaid Other | Source: Ambulatory Visit | Attending: Family Medicine | Admitting: Family Medicine

## 2015-08-17 DIAGNOSIS — Z3402 Encounter for supervision of normal first pregnancy, second trimester: Secondary | ICD-10-CM | POA: Insufficient documentation

## 2015-09-02 ENCOUNTER — Ambulatory Visit (INDEPENDENT_AMBULATORY_CARE_PROVIDER_SITE_OTHER): Payer: Medicaid Other | Admitting: Family Medicine

## 2015-09-02 VITALS — BP 119/68 | HR 108 | Wt 188.0 lb

## 2015-09-02 DIAGNOSIS — Z3402 Encounter for supervision of normal first pregnancy, second trimester: Secondary | ICD-10-CM

## 2015-09-02 NOTE — Progress Notes (Signed)
Subjective:  Emily Villanueva is a 19 y.o. G1P0 at [redacted]w[redacted]d being seen today for ongoing prenatal care.  She is currently monitored for the following issues for this low-risk pregnancy and has Supervision of normal first pregnancy, antepartum on her problem list.  Patient relays that her mother has paralysis and speech deficit after birth, which progressed after each pregnancy  Patient reports no complaints.  Contractions: Not present. Vag. Bleeding: None.  Movement: Present. Denies leaking of fluid.   The following portions of the patient's history were reviewed and updated as appropriate: allergies, current medications, past family history, past medical history, past social history, past surgical history and problem list. Problem list updated.  Objective:   Filed Vitals:   09/02/15 1501  BP: 119/68  Pulse: 108  Weight: 188 lb (85.276 kg)    Fetal Status: Fetal Heart Rate (bpm): 151   Movement: Present     General:  Alert, oriented and cooperative. Patient is in no acute distress.  Skin: Skin is warm and dry. No rash noted.   Cardiovascular: Normal heart rate noted  Respiratory: Normal respiratory effort, no problems with respiration noted  Abdomen: Soft, gravid, appropriate for gestational age. Pain/Pressure: Present     Pelvic: Vag. Bleeding: None Vag D/C Character: Thin   Cervical exam deferred        Extremities: Normal range of motion.  Edema: None  Mental Status: Normal mood and affect. Normal behavior. Normal judgment and thought content.   Urinalysis: Urine Protein: Negative Urine Glucose: Negative  Assessment and Plan:  Pregnancy: G1P0 at [redacted]w[redacted]d  1. Supervision of normal first pregnancy, antepartum, second trimester FH and FHT normal.  Incomplete views of heart - will repeat US. Will refer to MFM for mother's paralysis to see if anything different needs to occur.  Preterm labor symptoms and general obstetric precautions including but not limited to vaginal bleeding,  contractions, leaking of fluid and fetal movement were reviewed in detail with the patient. Please refer to After Visit Summary for other counseling recommendations.  No Follow-up on file.   Truett Mainland, DO

## 2015-09-02 NOTE — Patient Instructions (Signed)
Second Trimester of Pregnancy  The second trimester is from week 13 through week 28, month 4 through 6. This is often the time in pregnancy that you feel your best. Often times, morning sickness has lessened or quit. You may have more energy, and you may get hungry more often. Your unborn baby (fetus) is growing rapidly. At the end of the sixth month, he or she is about 9 inches long and weighs about 1½ pounds. You will likely feel the baby move (quickening) between 18 and 20 weeks of pregnancy.  HOME CARE   · Avoid all smoking, herbs, and alcohol. Avoid drugs not approved by your doctor.  · Do not use any tobacco products, including cigarettes, chewing tobacco, and electronic cigarettes. If you need help quitting, ask your doctor. You may get counseling or other support to help you quit.  · Only take medicine as told by your doctor. Some medicines are safe and some are not during pregnancy.  · Exercise only as told by your doctor. Stop exercising if you start having cramps.  · Eat regular, healthy meals.  · Wear a good support bra if your breasts are tender.  · Do not use hot tubs, steam rooms, or saunas.  · Wear your seat belt when driving.  · Avoid raw meat, uncooked cheese, and liter boxes and soil used by cats.  · Take your prenatal vitamins.  · Take 1500-2000 milligrams of calcium daily starting at the 20th week of pregnancy until you deliver your baby.  · Try taking medicine that helps you poop (stool softener) as needed, and if your doctor approves. Eat more fiber by eating fresh fruit, vegetables, and whole grains. Drink enough fluids to keep your pee (urine) clear or pale yellow.  · Take warm water baths (sitz baths) to soothe pain or discomfort caused by hemorrhoids. Use hemorrhoid cream if your doctor approves.  · If you have puffy, bulging veins (varicose veins), wear support hose. Raise (elevate) your feet for 15 minutes, 3-4 times a day. Limit salt in your diet.  · Avoid heavy lifting, wear low heals,  and sit up straight.  · Rest with your legs raised if you have leg cramps or low back pain.  · Visit your dentist if you have not gone during your pregnancy. Use a soft toothbrush to brush your teeth. Be gentle when you floss.  · You can have sex (intercourse) unless your doctor tells you not to.  · Go to your doctor visits.  GET HELP IF:   · You feel dizzy.  · You have mild cramps or pressure in your lower belly (abdomen).  · You have a nagging pain in your belly area.  · You continue to feel sick to your stomach (nauseous), throw up (vomit), or have watery poop (diarrhea).  · You have bad smelling fluid coming from your vagina.  · You have pain with peeing (urination).  GET HELP RIGHT AWAY IF:   · You have a fever.  · You are leaking fluid from your vagina.  · You have spotting or bleeding from your vagina.  · You have severe belly cramping or pain.  · You lose or gain weight rapidly.  · You have trouble catching your breath and have chest pain.  · You notice sudden or extreme puffiness (swelling) of your face, hands, ankles, feet, or legs.  · You have not felt the baby move in over an hour.  · You have severe headaches that do   not go away with medicine.  · You have vision changes.     This information is not intended to replace advice given to you by your health care provider. Make sure you discuss any questions you have with your health care provider.     Document Released: 12/14/2009 Document Revised: 10/10/2014 Document Reviewed: 11/20/2012  Elsevier Interactive Patient Education ©2016 Elsevier Inc[employer]

## 2015-09-15 ENCOUNTER — Encounter (HOSPITAL_COMMUNITY): Payer: Self-pay | Admitting: *Deleted

## 2015-09-15 ENCOUNTER — Inpatient Hospital Stay (HOSPITAL_COMMUNITY)
Admission: AD | Admit: 2015-09-15 | Discharge: 2015-09-15 | Disposition: A | Discharge status: Home / Self Care | Payer: Medicaid Other | Attending: Family Medicine | Admitting: Family Medicine

## 2015-09-15 ENCOUNTER — Telehealth: Payer: Self-pay

## 2015-09-15 DIAGNOSIS — Z3A23 23 weeks gestation of pregnancy: Secondary | ICD-10-CM | POA: Diagnosis not present

## 2015-09-15 DIAGNOSIS — Z91018 Allergy to other foods: Secondary | ICD-10-CM | POA: Insufficient documentation

## 2015-09-15 DIAGNOSIS — Z91010 Allergy to peanuts: Secondary | ICD-10-CM | POA: Insufficient documentation

## 2015-09-15 DIAGNOSIS — Z91011 Allergy to milk products: Secondary | ICD-10-CM | POA: Insufficient documentation

## 2015-09-15 DIAGNOSIS — Z87891 Personal history of nicotine dependence: Secondary | ICD-10-CM | POA: Diagnosis not present

## 2015-09-15 DIAGNOSIS — O26892 Other specified pregnancy related conditions, second trimester: Secondary | ICD-10-CM | POA: Diagnosis not present

## 2015-09-15 DIAGNOSIS — M419 Scoliosis, unspecified: Secondary | ICD-10-CM | POA: Diagnosis not present

## 2015-09-15 DIAGNOSIS — R42 Dizziness and giddiness: Secondary | ICD-10-CM | POA: Insufficient documentation

## 2015-09-15 DIAGNOSIS — O9989 Other specified diseases and conditions complicating pregnancy, childbirth and the puerperium: Secondary | ICD-10-CM

## 2015-09-15 DIAGNOSIS — Z91013 Allergy to seafood: Secondary | ICD-10-CM | POA: Diagnosis not present

## 2015-09-15 LAB — PROTEIN / CREATININE RATIO, URINE
Creatinine, Urine: 65 mg/dL
Protein Creatinine Ratio: 0.14 mg/mg{Cre} (ref 0.00–0.15)
Total Protein, Urine: 9 mg/dL

## 2015-09-15 LAB — COMPREHENSIVE METABOLIC PANEL
ALT: 13 U/L — ABNORMAL LOW (ref 14–54)
AST: 22 U/L (ref 15–41)
Albumin: 3.3 g/dL — ABNORMAL LOW (ref 3.5–5.0)
Alkaline Phosphatase: 56 U/L (ref 38–126)
Anion gap: 9 (ref 5–15)
BUN: 9 mg/dL (ref 6–20)
CO2: 22 mmol/L (ref 22–32)
Calcium: 8.9 mg/dL (ref 8.9–10.3)
Chloride: 103 mmol/L (ref 101–111)
Creatinine, Ser: 0.46 mg/dL (ref 0.44–1.00)
GFR calc Af Amer: >60 mL/min (ref >60)
GFR calc non Af Amer: >60 mL/min (ref >60)
Glucose, Bld: 100 mg/dL — ABNORMAL HIGH (ref 65–99)
Potassium: 3.7 mmol/L (ref 3.5–5.1)
Sodium: 134 mmol/L — ABNORMAL LOW (ref 135–145)
Total Bilirubin: 0.3 mg/dL (ref 0.3–1.2)
Total Protein: 6.2 g/dL — ABNORMAL LOW (ref 6.5–8.1)

## 2015-09-15 LAB — CBC
HCT: 36.2 % (ref 36.0–46.0)
Hemoglobin: 12.2 g/dL (ref 12.0–15.0)
MCH: 32.1 pg (ref 26.0–34.0)
MCHC: 33.7 g/dL (ref 30.0–36.0)
MCV: 95.3 fL (ref 78.0–100.0)
Platelets: 195 10*3/uL (ref 150–400)
RBC: 3.8 MIL/uL — ABNORMAL LOW (ref 3.87–5.11)
RDW: 13.4 % (ref 11.5–15.5)
WBC: 11.8 10*3/uL — ABNORMAL HIGH (ref 4.0–10.5)

## 2015-09-15 LAB — URINALYSIS, ROUTINE W REFLEX MICROSCOPIC
BILIRUBIN URINE: NEGATIVE
Glucose, UA: NEGATIVE mg/dL
Hgb urine dipstick: NEGATIVE
KETONES UR: NEGATIVE mg/dL
Leukocytes, UA: NEGATIVE
NITRITE: NEGATIVE
PH: 7.5 (ref 5.0–8.0)
PROTEIN: NEGATIVE mg/dL
Specific Gravity, Urine: 1.01 (ref 1.005–1.030)

## 2015-09-15 NOTE — Discharge Instructions (Signed)
Dizziness Dizziness is a common problem. It makes you feel unsteady or lightheaded. You may feel like you are about to pass out (faint). Dizziness can lead to injury if you stumble or fall. Anyone can get dizzy, but dizziness is more common in older adults. This condition can be caused by a number of things, including:  Medicines.  Dehydration.  Illness. HOME CARE Following these instructions may help with your condition: Eating and Drinking  Drink enough fluid to keep your pee (urine) clear or pale yellow. This helps to keep you from getting dehydrated. Try to drink more clear fluids, such as water.  Do not drink alcohol.  Limit how much caffeine you drink or eat if told by your doctor.  Limit how much salt you drink or eat if told by your doctor. Activity  Avoid making quick movements.  When you stand up from sitting in a chair, steady yourself until you feel okay.  In the morning, first sit up on the side of the bed. When you feel okay, stand slowly while you hold onto something. Do this until you know that your balance is fine.  Move your legs often if you need to stand in one place for a long time. Tighten and relax your muscles in your legs while you are standing.  Do not drive or use heavy machinery if you feel dizzy.  Avoid bending down if you feel dizzy. Place items in your home so that they are easy for you to reach without leaning over. Lifestyle  Do not use any tobacco products, including cigarettes, chewing tobacco, or electronic cigarettes. If you need help quitting, ask your doctor.  Try to lower your stress level, such as with yoga or meditation. Talk with your doctor if you need help. General Instructions  Watch your dizziness for any changes.  Take medicines only as told by your doctor. Talk with your doctor if you think that your dizziness is caused by a medicine that you are taking.  Tell a friend or a family member that you are feeling dizzy. If he or  she notices any changes in your behavior, have this person call your doctor.  Keep all follow-up visits as told by your doctor. This is important. GET HELP IF:  Your dizziness does not go away.  Your dizziness or light-headedness gets worse.  You feel sick to your stomach (nauseous).  You have trouble hearing.  You have new symptoms.  You are unsteady on your feet or you feel like the room is spinning. GET HELP RIGHT AWAY IF:  You throw up (vomit) or have diarrhea and are unable to eat or drink anything.  You have trouble:  Talking.  Walking.  Swallowing.  Using your arms, hands, or legs.  You feel generally weak.  You are not thinking clearly or you have trouble forming sentences. It may take a friend or family member to notice this.  You have:  Chest pain.  Pain in your belly (abdomen).  Shortness of breath.  Sweating.  Your vision changes.  You are bleeding.  You have a headache.  You have neck pain or a stiff neck.  You have a fever.   This information is not intended to replace advice given to you by your health care provider. Make sure you discuss any questions you have with your health care provider.   Document Released: 09/08/2011 Document Revised: 02/03/2015 Document Reviewed: 09/15/2014 Elsevier Interactive Patient Education 2016 [place] of Pregnancy The  second trimester is from week 13 through week 28, months 4 through 6. The second trimester is often a time when you feel your best. Your body has also adjusted to being pregnant, and you begin to feel better physically. Usually, morning sickness has lessened or quit completely, you may have more energy, and you may have an increase in appetite. The second trimester is also a time when the fetus is growing rapidly. At the end of the sixth month, the fetus is about 9 inches long and weighs about 1 pounds. You will likely begin to feel the baby move (quickening) between 18 and  20 weeks of the pregnancy. BODY CHANGES Your body goes through many changes during pregnancy. The changes vary from woman to woman.   Your weight will continue to increase. You will notice your lower abdomen bulging out.  You may begin to get stretch marks on your hips, abdomen, and breasts.  You may develop headaches that can be relieved by medicines approved by your health care provider.  You may urinate more often because the fetus is pressing on your bladder.  You may develop or continue to have heartburn as a result of your pregnancy.  You may develop constipation because certain hormones are causing the muscles that push waste through your intestines to slow down.  You may develop hemorrhoids or swollen, bulging veins (varicose veins).  You may have back pain because of the weight gain and pregnancy hormones relaxing your joints between the bones in your pelvis and as a result of a shift in weight and the muscles that support your balance.  Your breasts will continue to grow and be tender.  Your gums may bleed and may be sensitive to brushing and flossing.  Dark spots or blotches (chloasma, mask of pregnancy) may develop on your face. This will likely fade after the baby is born.  A dark line from your belly button to the pubic area (linea nigra) may appear. This will likely fade after the baby is born.  You may have changes in your hair. These can include thickening of your hair, rapid growth, and changes in texture. Some women also have hair loss during or after pregnancy, or hair that feels dry or thin. Your hair will most likely return to normal after your baby is born. WHAT TO EXPECT AT YOUR PRENATAL VISITS During a routine prenatal visit:  You will be weighed to make sure you and the fetus are growing normally.  Your blood pressure will be taken.  Your abdomen will be measured to track your baby's growth.  The fetal heartbeat will be listened to.  Any test results  from the previous visit will be discussed. Your health care provider may ask you:  How you are feeling.  If you are feeling the baby move.  If you have had any abnormal symptoms, such as leaking fluid, bleeding, severe headaches, or abdominal cramping.  If you are using any tobacco products, including cigarettes, chewing tobacco, and electronic cigarettes.  If you have any questions. Other tests that may be performed during your second trimester include:  Blood tests that check for:  Low iron levels (anemia).  Gestational diabetes (between 24 and 28 weeks).  Rh antibodies.  Urine tests to check for infections, diabetes, or protein in the urine.  An ultrasound to confirm the proper growth and development of the baby.  An amniocentesis to check for possible genetic problems.  Fetal screens for spina bifida and Down syndrome.  HIV (human immunodeficiency virus) testing. Routine prenatal testing includes screening for HIV, unless you choose not to have this test. HOME CARE INSTRUCTIONS   Avoid all smoking, herbs, alcohol, and unprescribed drugs. These chemicals affect the formation and growth of the baby.  Do not use any tobacco products, including cigarettes, chewing tobacco, and electronic cigarettes. If you need help quitting, ask your health care provider. You may receive counseling support and other resources to help you quit.  Follow your health care provider's instructions regarding medicine use. There are medicines that are either safe or unsafe to take during pregnancy.  Exercise only as directed by your health care provider. Experiencing uterine cramps is a good sign to stop exercising.  Continue to eat regular, healthy meals.  Wear a good support bra for breast tenderness.  Do not use hot tubs, steam rooms, or saunas.  Wear your seat belt at all times when driving.  Avoid raw meat, uncooked cheese, cat litter boxes, and soil used by cats. These carry germs that  can cause birth defects in the baby.  Take your prenatal vitamins.  Take 1500-2000 mg of calcium daily starting at the 20th week of pregnancy until you deliver your baby.  Try taking a stool softener (if your health care provider approves) if you develop constipation. Eat more high-fiber foods, such as fresh vegetables or fruit and whole grains. Drink plenty of fluids to keep your urine clear or pale yellow.  Take warm sitz baths to soothe any pain or discomfort caused by hemorrhoids. Use hemorrhoid cream if your health care provider approves.  If you develop varicose veins, wear support hose. Elevate your feet for 15 minutes, 3-4 times a day. Limit salt in your diet.  Avoid heavy lifting, wear low heel shoes, and practice good posture.  Rest with your legs elevated if you have leg cramps or low back pain.  Visit your dentist if you have not gone yet during your pregnancy. Use a soft toothbrush to brush your teeth and be gentle when you floss.  A sexual relationship may be continued unless your health care provider directs you otherwise.  Continue to go to all your prenatal visits as directed by your health care provider. SEEK MEDICAL CARE IF:   You have dizziness.  You have mild pelvic cramps, pelvic pressure, or nagging pain in the abdominal area.  You have persistent nausea, vomiting, or diarrhea.  You have a bad smelling vaginal discharge.  You have pain with urination. SEEK IMMEDIATE MEDICAL CARE IF:   You have a fever.  You are leaking fluid from your vagina.  You have spotting or bleeding from your vagina.  You have severe abdominal cramping or pain.  You have rapid weight gain or loss.  You have shortness of breath with chest pain.  You notice sudden or extreme swelling of your face, hands, ankles, feet, or legs.  You have not felt your baby move in over an hour.  You have severe headaches that do not go away with medicine.  You have vision changes.     This information is not intended to replace advice given to you by your health care provider. Make sure you discuss any questions you have with your health care provider.   Document Released: 09/13/2001 Document Revised: 10/10/2014 Document Reviewed: 11/20/2012 Elsevier Interactive Patient Education [employer].

## 2015-09-15 NOTE — MAU Note (Signed)
efm strip reviewed and ok to d/c efm per McDonald

## 2015-09-15 NOTE — MAU Note (Signed)
Face fills very hot but do not have fever. Very dizzy.

## 2015-09-15 NOTE — MAU Provider Note (Signed)
History     CSN: [id number]  Arrival date and time: 09/15/15 1652   None     Chief Complaint  Patient presents with  . Hot Flashes  . Dizziness   HPI  Emily Villanueva 19 y.o. G1P0 [redacted]w[redacted]d presents to MAU with the complaint of redness in her face and feeling hot along with being very dizzy for 2-3 days. Denies vaginal bleeding , LOF, contractions. Reports positive fetal movement.  Past Medical History  Diagnosis Date  . Scoliosis   . Scoliosis     Past Surgical History  Procedure Laterality Date  . Tonsillectomy      Family History  Problem Relation Age of Onset  . Stroke Mother     Social History  Substance Use Topics  . Smoking status: Former Smoker -- 1.50 packs/day for 3 years  . Smokeless tobacco: None  . Alcohol Use: No    Allergies:  Allergies  Allergen Reactions  . Gluten Meal Hives  . Lactose Intolerance (Gi) Hives  . Peanuts [Peanut Oil] Hives  . Shrimp [Shellfish Allergy] Hives  . Wheat Bran Hives    Prescriptions prior to admission  Medication Sig Dispense Refill Last Dose  . Prenat Vit-Fe Gly Cys-FA-Omega (ENBRACE HR) CAPS   5 Taking  . Prenatal Vit-Fe Fumarate-FA (PRENATAL MULTIVITAMIN) TABS tablet Take 1 tablet by mouth daily at 12 noon.   Taking    Review of Systems  Neurological: Positive for dizziness and headaches.  All other systems reviewed and are negative.  Physical Exam   Blood pressure 141/85, pulse 93, temperature 97.9 F (36.6 C), resp. rate 20, height 5\' 10"  (1.778 m), weight 86.002 kg (189 lb 9.6 oz), last menstrual period 04/06/2015.  Physical Exam  Nursing note and vitals reviewed. Constitutional: She appears well-developed and well-nourished. No distress.  HENT:  Head: Normocephalic and atraumatic.  Cardiovascular: Normal rate.   Respiratory: Effort normal and breath sounds normal. No respiratory distress.  GI: Soft. There is no tenderness.  Musculoskeletal: Normal range of motion. She exhibits edema.  Edema from  knees down +1  Neurological: She displays abnormal reflex.  +3 Brisk negative homans  Skin: Skin is warm and dry. There is erythema.  Face   Psychiatric: She has a normal mood and affect. Her behavior is normal. Judgment and thought content normal.   09/15/15 1801  --  85  --  --  115/67 mmHg  --  --  --  -- JB     09/15/15 1746  --  93  --  --  122/68 mmHg  --  --  --  -- JB    09/15/15 1731  --  86  --  --  131/72 mmHg  --  --  --  -- JB    09/15/15 1720  --  94  --  --  134/81 mmHg  --  --  --  -- JB    09/15/15 1711  97.9 F (36.6 C)  93  --  20  141/85 mmHg  Semi-fowlers  --  --  -- JB    Results for orders placed or performed during the hospital encounter of 09/15/15 (from the past 24 hour(s))  Urinalysis, Routine w reflex microscopic (not at [facility])     Status: None   Collection Time: 09/15/15  5:00 PM  Result Value Ref Range   Color, Urine YELLOW YELLOW   APPearance CLEAR CLEAR   Specific Gravity, Urine 1.010 1.005 - 1.030   pH 7.5 5.0 -  8.0   Glucose, UA NEGATIVE NEGATIVE mg/dL   Hgb urine dipstick NEGATIVE NEGATIVE   Bilirubin Urine NEGATIVE NEGATIVE   Ketones, ur NEGATIVE NEGATIVE mg/dL   Protein, ur NEGATIVE NEGATIVE mg/dL   Nitrite NEGATIVE NEGATIVE   Leukocytes, UA NEGATIVE NEGATIVE  Protein / creatinine ratio, urine     Status: None   Collection Time: 09/15/15  5:00 PM  Result Value Ref Range   Creatinine, Urine 65.00 mg/dL   Total Protein, Urine 9 mg/dL   Protein Creatinine Ratio 0.14 0.00 - 0.15 mg/mg[Cre]  CBC     Status: Abnormal   Collection Time: 09/15/15  5:27 PM  Result Value Ref Range   WBC 11.8 (H) 4.0 - 10.5 K/uL   RBC 3.80 (L) 3.87 - 5.11 MIL/uL   Hemoglobin 12.2 12.0 - 15.0 g/dL   HCT 36.2 36.0 - 46.0 %   MCV 95.3 78.0 - 100.0 fL   MCH 32.1 26.0 - 34.0 pg   MCHC 33.7 30.0 - 36.0 g/dL   RDW 13.4 11.5 - 15.5 %   Platelets 195 150 - 400 K/uL  Comprehensive metabolic panel     Status: Abnormal   Collection Time: 09/15/15  5:27 PM  Result  Value Ref Range   Sodium 134 (L) 135 - 145 mmol/L   Potassium 3.7 3.5 - 5.1 mmol/L   Chloride 103 101 - 111 mmol/L   CO2 22 22 - 32 mmol/L   Glucose, Bld 100 (H) 65 - 99 mg/dL   BUN 9 6 - 20 mg/dL   Creatinine, Ser 0.46 0.44 - 1.00 mg/dL   Calcium 8.9 8.9 - 10.3 mg/dL   Total Protein 6.2 (L) 6.5 - 8.1 g/dL   Albumin 3.3 (L) 3.5 - 5.0 g/dL   AST 22 15 - 41 U/L   ALT 13 (L) 14 - 54 U/L   Alkaline Phosphatase 56 38 - 126 U/L   Total Bilirubin 0.3 0.3 - 1.2 mg/dL   GFR calc non Af Amer >60 >60 mL/min   GFR calc Af Amer >60 >60 mL/min   Anion gap 9 5 - 15   MAU Course  Procedures  MDM Serial B/p's and Pre E labs; all normal; F/U regular scheduled appointment  Assessment and Plan  Dizziness in Pregnancy  Discharge to home  [facility] [place] 09/15/2015, 5:20 PM

## 2015-09-15 NOTE — Progress Notes (Signed)
Lori Clemmons CNM in to discuss test results and d/c plan. Written and verbal d/c instructions given and understanding voiced. 

## 2015-09-15 NOTE — Telephone Encounter (Signed)
Patient called stating she has been dizzy the last few days. Patient states today she became very dizzy and "feels like my head and face are one fire". Patient denies any bleeding. Patient instructed to have someone drive her to MAU at [facility] to be evaluated. Instructed patient to go to [facility] for evaluation since she is pregnant. Patient states understanding. Kathrene Alu RN BSN

## 2015-09-18 ENCOUNTER — Ambulatory Visit (HOSPITAL_COMMUNITY): Admission: RE | Admit: 2015-09-18 | Payer: Medicaid Other | Source: Ambulatory Visit

## 2015-09-18 ENCOUNTER — Ambulatory Visit (HOSPITAL_COMMUNITY)
Admission: RE | Admit: 2015-09-18 | Discharge: 2015-09-18 | Disposition: A | Discharge status: Home / Self Care | Payer: Medicaid Other | Source: Ambulatory Visit

## 2015-09-18 ENCOUNTER — Ambulatory Visit (HOSPITAL_COMMUNITY)
Admission: RE | Admit: 2015-09-18 | Discharge: 2015-09-18 | Disposition: A | Discharge status: Home / Self Care | Payer: Medicaid Other | Source: Ambulatory Visit | Attending: Family Medicine | Admitting: Family Medicine

## 2015-09-18 DIAGNOSIS — Z820 Family history of epilepsy and other diseases of the nervous system: Secondary | ICD-10-CM

## 2015-09-18 DIAGNOSIS — Z315 Encounter for genetic counseling: Secondary | ICD-10-CM | POA: Insufficient documentation

## 2015-09-18 DIAGNOSIS — Z3402 Encounter for supervision of normal first pregnancy, second trimester: Secondary | ICD-10-CM

## 2015-09-18 DIAGNOSIS — Z36 Encounter for antenatal screening of mother: Secondary | ICD-10-CM | POA: Insufficient documentation

## 2015-09-18 DIAGNOSIS — Z3A23 23 weeks gestation of pregnancy: Secondary | ICD-10-CM | POA: Insufficient documentation

## 2015-09-21 DIAGNOSIS — Z820 Family history of epilepsy and other diseases of the nervous system: Secondary | ICD-10-CM

## 2015-09-21 HISTORY — DX: Family history of epilepsy and other diseases of the nervous system: Z82.0

## 2015-09-21 NOTE — Progress Notes (Signed)
Genetic Counseling  High-Risk Gestation Note  Appointment Date:  09/18/2015 Referred By: Truett Mainland, DO Date of Birth:  07-Feb-1996   Pregnancy History: G1P0 Estimated Date of Delivery: 01/11/16 Estimated Gestational Age: 27w4dAttending: BSeward Meth MD   I met with Emily Villanueva her partner for genetic counseling because of a family history of paralysis for her mother.  In Summary:   Patient reported progressive movement disorder for her mother and maternal uncle  Specific etiology unknown by patient  Patient's mother's symptoms reportedly occurred and were worsened with childbirth  Symptoms primarily described as hypertonia, paralysis, and dysarthria  This history is suggestive of a genetic form of movement disorder such as dystonia or muscular dystrophy  Recurrence risk could be up to 50% for patient (if autosomal dominant condition) or low if autosomal recessive or multifactorial  Patient planned to ask her mother to return a permission for release of medical information form so that medical records could be reviewed  Neurology evaluation for patient is available to assess for potential features of her mother's condition  If genetic testing was performed for her mother and was informative, genetic testing would be available to the patient and other relatives  We began by reviewing the family history in detail. The patient reported that her mother has a progressive condition affecting her muscles that causes paralysis. The first onset was following her mother's labor and delivery of her first child, when she was approximately early-mid 20's. Her mother is currently early-mid 517's Currently the left side of her body is primarily affected, with her left hand remaining in a clenched position. She was described to have difficulty moving her left leg/bending her left knee while walking. The patient reported that her mother's speech has recently become difficult to  understand. Her condition was described as her muscles getting tighter and tighter. She also reportedly has severe headaches and depression. [facility] The patient reported that her maternal uncle also has the same condition. Onset of features for him was reportedly in his 20s-30s. He was described to no longer be able to speak but was described to walk better than the patient' mother, requiring assistance for walking, though. The patient reported that she has two maternal aunts who are reportedly asymptomatic, and the patient reported that her grandparents died when the patient was very young, but she does not know of either of them having a history of similar features or symptoms. The patient's maternal half-sister is currently 333years old with four children, and she reportedly has no features similar to their mother. Ms. WRickettsreported that she has slight scoliosis and in the past had foot cramps on three separate occasions in adolescence when her foot was elevated to shave her legs. She reported no additional concerns related to her muscles.   We discussed that there are many causes for movement/muscle conditions, including genetics, environment, and multifactorial causes.  The reported family history may be consistent with a genetic or partially genetic form of a muscular or neuromuscular condition, such as a form of dystonia, myotonic dystrophy, muscular dystrophy.  Dystonia is describes movement disorders characterized by sustained or intermittent muscle contractions, which can cause abnormal movements and/or abnormal postures. Muscular dystrophies comprise a group of disorders characterized by progressive muscle weakness and atrophy.  We discussed that these conditions can follow autosomal dominant, autosomal recessive, and X-linked inheritance. Variable expressivity is often described for these conditions, meaning that individuals within a family  may display  varying symptoms or features of the condition, and ages of onset may vary. Some forms of these conditions, such as dystonias, are described to have environmental triggers that can cause onset or exacerbations of the features such as alcohol, psychological stress, or excessive physical exertion (such as childbirth, as described by the patient for her mother).   We reviewed genes and chromosomes. The reported family history is most consistent with either autosomal dominant or autosomal recessive inheritance, in the case an underlying genetic form of a progressive movement disorder.   In autosomal dominant inheritance, one copy of a nonworking gene in a particular gene pair can lead to features of the particular condition. Males and females have equal chance to be affected. Thus, each offspring for an individual with an autosomal dominant condition has a 1 in 2 (50%) chance to inherit the condition. Individuals who do not inherit the gene change would not be at increased risk to pass it down. Inherited forms of movement disorders, such as dystonia, can sometime display reduced penetrance, meaning an individual with the inherited nonworking gene may not necessarily develop symptoms but would still have a chance to pass it down to offspring. In the case of an autosomal dominant movement disorder in the patient's mother and uncle, Emily Villanueva would have a 1 in 2 (50%) chance to also have the condition, with potentially similar triggers for onset. If she has inherited the condition and in the case of autosomal dominant inheritance, the current pregnancy would have a 1 in 2 (50%) chance to also inherit the condition.   We reviewed autosomal recessive inheritance, where an individual inherits two copies of a nonworking gene chance, one from each parent, in order to have the particular condition. All offspring of an affected individual would be obligate carriers, meaning they have one nonworking copy and one working copy  of the gene, expected to be asymptomatic. In the case of an underlying autosomal recessive condition in the family, Emily Villanueva would be expected to be a carrier, but her risk to be affected would be low, given no known history for her father. Also in the case of an autosomal recessive form of the reported condition, recurrence risk for the current pregnancy would be low, given no known family history for the father of the pregnancy.    We discussed that additional information regarding the patient's mother's diagnosis is needed in order to accurately assess recurrence risk for Emily Villanueva and additional relatives. We discussed the option of reviewing medical records. The patient expressed that her mother would likely be willing to give permission for this and was given a permission for release of medical information for her mother to complete and return to [place]. Additionally, we discussed that a neurology evaluation may be most informative for the patient at this time to assess for possible features of the condition reported in her mother and uncle. Additional information obtained from her mother's medical records may alter recurrence risk assessment and potentially better guide medical management for the patient. No follow-up scheduled with our office at this time; information obtained from the patient's mother's medical records, once release of information permission form received, may dictate further follow-up with our office.   The father of the pregnancy, Liane Comber, reported a personal history of anxiety, attention deficit hyperactivity disorder (ADHD), and post-traumatic stress disorder. We discussed that for the majority of cases of mental health conditions, an underlying genetic cause is not known but a combination of genetic and  environmental factors (multifactorial inheritance) are suspected to contribute to their onset.  Recurrence risk would be increased for offspring of an individual with mental health  conditions. We discussed that it might be helpful for pediatricians to be aware of this family history to ensure that family members are followed appropriately. The patient understands that prenatal testing or screening is not available for the majority of mental health conditions.   Additionally, the father of the pregnancy reported a paternal half-brother who had Trisomy 22 and passed away in infancy. He reported that this relative was born relatively recently. We reviewed chromosomes and that the 72 of cases of trisomy 47 are due to nondisjunction and not expected to affect recurrence risk for fetal aneuploidy for extended relatives. In some cases, the condition may be due to an underlying chromosome rearrangement, which could potentially be inherited from relatives. Given the reported family history, trisomy 48 was most likely sporadic, in which case recurrence risk for the current pregnancy would not be expected to be increased. Additional information regarding this relative's karyotype may alter recurrence risk assessment. The family histories were otherwise found to be noncontributory for birth defects, mental retardation, and known genetic conditions. Without further information regarding the provided family history, an accurate genetic risk cannot be calculated. Further genetic counseling is warranted if more information is obtained.  Detailed ultrasound performed today and within normal limits. Complete ultrasound results under separate cover.   I counseled this couple regarding the above risks and available options.  The approximate face-to-face time with the genetic counselor was 40 minutes.  Chipper Oman, MS Certified Genetic Counselor 09/21/2015

## 2015-09-23 ENCOUNTER — Ambulatory Visit (INDEPENDENT_AMBULATORY_CARE_PROVIDER_SITE_OTHER): Payer: Medicaid Other | Admitting: Obstetrics & Gynecology

## 2015-09-23 VITALS — BP 129/69 | HR 98 | Wt 194.0 lb

## 2015-09-23 DIAGNOSIS — O99210 Obesity complicating pregnancy, unspecified trimester: Secondary | ICD-10-CM

## 2015-09-23 DIAGNOSIS — E669 Obesity, unspecified: Secondary | ICD-10-CM

## 2015-09-23 DIAGNOSIS — Z3402 Encounter for supervision of normal first pregnancy, second trimester: Secondary | ICD-10-CM

## 2015-09-23 DIAGNOSIS — O99212 Obesity complicating pregnancy, second trimester: Secondary | ICD-10-CM

## 2015-09-23 NOTE — Progress Notes (Signed)
Subjective:  Emily Villanueva is a 19 y.o. G1P0 at [redacted]w[redacted]d being seen today for ongoing prenatal care.  She is currently monitored for the following issues for this low-risk pregnancy and has Supervision of normal first pregnancy, antepartum; Family history of movement disorder; and Obesity in pregnancy, antepartum on her problem list.  Patient reports no complaints.  Contractions: Not present. Vag. Bleeding: None.  Movement: Present. Denies leaking of fluid.   The following portions of the patient's history were reviewed and updated as appropriate: allergies, current medications, past family history, past medical history, past social history, past surgical history and problem list. Problem list updated.  Objective:   Filed Vitals:   09/23/15 1411  BP: 129/69  Pulse: 98  Weight: 194 lb (87.998 kg)    Fetal Status: Fetal Heart Rate (bpm): 142   Movement: Present     General:  Alert, oriented and cooperative. Patient is in no acute distress.  Skin: Skin is warm and dry. No rash noted.   Cardiovascular: Normal heart rate noted  Respiratory: Normal respiratory effort, no problems with respiration noted  Abdomen: Soft, gravid, appropriate for gestational age. Pain/Pressure: Present     Pelvic: Vag. Bleeding: None Vag D/C Character: Thin   Cervical exam deferred        Extremities: Normal range of motion.  Edema: None  Mental Status: Normal mood and affect. Normal behavior. Normal judgment and thought content.   Urinalysis: Urine Protein: Negative Urine Glucose: Trace  Assessment and Plan:  Pregnancy: G1P0 at [redacted]w[redacted]d  1. Obesity in pregnancy, antepartum, unspecified trimester   2. Supervision of normal first pregnancy, antepartum, second trimester -glucola, labs, tdap at next visit  Preterm labor symptoms and general obstetric precautions including but not limited to vaginal bleeding, contractions, leaking of fluid and fetal movement were reviewed in detail with the patient. Please refer to  After Visit Summary for other counseling recommendations.  No Follow-up on file.   Emily Filbert, MD

## 2015-10-04 NOTE — L&D Delivery Note (Signed)
Patient is 20 y.o. G1P0 [redacted]w[redacted]d admitted with SROM with active labor. No augmentation needed. Hx of anxiety/depression on zoloft.    Delivery Note At 7:46 AM a viable female was delivered via Vaginal, Spontaneous Delivery (Presentation: LOA).  APGAR: 8, 9; weight pending.  Placenta status: Intact, Spontaneous.  Cord: 3 vessels with the following complications: None. Upon arrival patient was complete and pushing. Baby delivered without difficulty, was noted to have good tone and place on maternal abdomen for oral suctioning, drying and stimulation. Delayed cord clamping performed.   Anesthesia: Epidural  Episiotomy: None Lacerations: left Labial, hemostatic, no repair Suture Repair: None Est. Blood Loss (mL): 150  Mom to postpartum.  Baby to Couplet care / Skin to Skin.  Luiz Blare, DO 12/23/2015, 8:51 AM PGY-2, [facility]  I was present for the entire delivery of baby and placenta and inspection of the peruneum agree with above. SW consult for severe anxiety. Continue Zoloft.  Consider cardiology F/U OP for questionable Rapid reentry tachycardia observed during labor.   Etna Green, [place] 12/23/2015 9:01 AM

## 2015-10-15 ENCOUNTER — Telehealth (HOSPITAL_COMMUNITY): Payer: Self-pay | Admitting: MS"

## 2015-10-15 NOTE — Telephone Encounter (Signed)
Called patient to follow-up regarding possible medical records review for her mother. The patient stated that her mother told her that the movement disorder is a form of dystonia. The patient stated, however, that her mother refuses to sign the medical records release permission form because she is very religious and believes that her daughter does not have the condition.   The patient stated that for herself, she feels recent mood changes and is having pain in her hip/groin that is reportedly similar to what her mother had when she was pregnant. Discussed that I'm not able to address those questions as well as genetic questions and encouraged patient to contact her OB office if it's a concern for her prior to her next appointment. Patient stated that she has an OB visit on 1/16.   Santiago Glad Bryley Kovacevic 10/15/2015 4:35 PM

## 2015-10-19 ENCOUNTER — Encounter: Payer: Self-pay | Admitting: Obstetrics & Gynecology

## 2015-10-19 ENCOUNTER — Ambulatory Visit (INDEPENDENT_AMBULATORY_CARE_PROVIDER_SITE_OTHER): Payer: Medicaid Other | Admitting: Obstetrics & Gynecology

## 2015-10-19 VITALS — BP 112/64 | HR 104 | Wt 197.0 lb

## 2015-10-19 DIAGNOSIS — F339 Major depressive disorder, recurrent, unspecified: Secondary | ICD-10-CM | POA: Insufficient documentation

## 2015-10-19 DIAGNOSIS — Z3492 Encounter for supervision of normal pregnancy, unspecified, second trimester: Secondary | ICD-10-CM

## 2015-10-19 DIAGNOSIS — Z820 Family history of epilepsy and other diseases of the nervous system: Secondary | ICD-10-CM

## 2015-10-19 DIAGNOSIS — Z36 Encounter for antenatal screening of mother: Secondary | ICD-10-CM

## 2015-10-19 DIAGNOSIS — Z3403 Encounter for supervision of normal first pregnancy, third trimester: Secondary | ICD-10-CM

## 2015-10-19 DIAGNOSIS — Z23 Encounter for immunization: Secondary | ICD-10-CM | POA: Diagnosis not present

## 2015-10-19 DIAGNOSIS — E669 Obesity, unspecified: Secondary | ICD-10-CM

## 2015-10-19 DIAGNOSIS — O99343 Other mental disorders complicating pregnancy, third trimester: Secondary | ICD-10-CM

## 2015-10-19 DIAGNOSIS — Z3402 Encounter for supervision of normal first pregnancy, second trimester: Secondary | ICD-10-CM

## 2015-10-19 DIAGNOSIS — F329 Major depressive disorder, single episode, unspecified: Secondary | ICD-10-CM

## 2015-10-19 DIAGNOSIS — O99213 Obesity complicating pregnancy, third trimester: Secondary | ICD-10-CM

## 2015-10-19 DIAGNOSIS — F32A Depression, unspecified: Secondary | ICD-10-CM

## 2015-10-19 HISTORY — DX: Major depressive disorder, recurrent, unspecified: F33.9

## 2015-10-19 LAB — CBC
HEMATOCRIT: 34.8 % — AB (ref 36.0–46.0)
HEMOGLOBIN: 12.9 g/dL (ref 12.0–15.0)
MCH: 33.7 pg (ref 26.0–34.0)
MCHC: 37.1 g/dL — ABNORMAL HIGH (ref 30.0–36.0)
MCV: 90.9 fL (ref 78.0–100.0)
MPV: 10.5 fL (ref 8.6–12.4)
Platelets: 197 10*3/uL (ref 150–400)
RBC: 3.83 MIL/uL — AB (ref 3.87–5.11)
RDW: 13.2 % (ref 11.5–15.5)
WBC: 12.6 10*3/uL — ABNORMAL HIGH (ref 4.0–10.5)

## 2015-10-19 MED ORDER — SERTRALINE HCL 50 MG PO TABS: 50.0000 mg | ORAL_TABLET | Freq: Every day | ORAL | Status: DC

## 2015-10-19 NOTE — Progress Notes (Signed)
Subjective:  Emily Villanueva is a 20 y.o. G1P0 at [redacted]w[redacted]d being seen today for ongoing prenatal care.  She is currently monitored for the following issues for this low-risk pregnancy and has Supervision of normal first pregnancy, antepartum; Family history of movement disorder; Obesity in pregnancy, antepartum; and Depression complicating pregnancy, antepartum on her problem list.  Patient reports sx of depression.  No active suicidal thoughts.  Reports being on an antidepressant prior to pregnancy.  Does not know the name of it.  She has kept her pregnancy a secret in the past.  Contractions: Not present. Vag. Bleeding: None.  Movement: Present. Denies leaking of fluid.   The following portions of the patient's history were reviewed and updated as appropriate: allergies, current medications, past family history, past medical history, past social history, past surgical history and problem list. Problem list updated.  Objective:   Filed Vitals:   10/19/15 0850  BP: 112/64  Pulse: 104  Weight: 197 lb (89.359 kg)    Fetal Status: Fetal Heart Rate (bpm): 144   Movement: Present     General:  Alert, oriented and cooperative. Patient is in no acute distress.  Skin: Skin is warm and dry. No rash noted.   Cardiovascular: Normal heart rate noted  Respiratory: Normal respiratory effort, no problems with respiration noted  Abdomen: Soft, gravid, appropriate for gestational age. Pain/Pressure: Present     Pelvic: Vag. Bleeding: None Vag D/C Character: Thin   Cervical exam deferred        Extremities: Normal range of motion.  Edema: None  Mental Status: Normal mood and affect. Normal behavior. Normal judgment and thought content.   Urinalysis: Urine Protein: Negative Urine Glucose: Trace  Assessment and Plan:  Pregnancy: G1P0 at [redacted]w[redacted]d  1. Prenatal care, second trimester - Glucose Tolerance, 1 HR (50g) - CBC - RPR - HIV antibody (with reflex)  2. Supervision of normal first pregnancy, antepartum,  third trimester Flu vac today Tdap today  3. Obesity in pregnancy, antepartum, third trimester Normal weight gain  4. Family history of movement disorder  5. Depression complicating pregnancy, antepartum, third trimester Will begin Zoloft today.    Preterm labor symptoms and general obstetric precautions including but not limited to vaginal bleeding, contractions, leaking of fluid and fetal movement were reviewed in detail with the patient. Please refer to After Visit Summary for other counseling recommendations.  Return in about 2 weeks (around 11/02/2015).   Lavonia Drafts, MD

## 2015-10-19 NOTE — Progress Notes (Signed)
Patient doing glucola today. Kathrene Alu RN BSN

## 2015-10-19 NOTE — Patient Instructions (Signed)
Glucose Tolerance Test During Pregnancy The glucose tolerance test (GTT) is a blood test used to determine if you have developed a type of diabetes during pregnancy (gestational diabetes). This is when your body does not properly process sugar (glucose) in the food you eat, resulting in high blood glucose levels. Typically, a GTT is done after you have had a 1-hour glucose test with results that indicate you possibly have gestational diabetes. It may also be done if:  You have a history of giving birth to very large babies or have experienced repeated fetal loss (stillbirth).   You have signs and symptoms of diabetes, such as:   Changes in your vision.   Tingling or numbness in your hands or feet.   Changes in hunger, thirst, and urination not otherwise explained by your pregnancy.  The GTT lasts about 3 hours. You will be given a sugar-water solution to drink at the beginning of the test. You will have blood drawn before you drink the solution and then again 1, 2, and 3 hours after you drink it. You will not be allowed to eat or drink anything else during the test. You must remain at the testing location to make sure that your blood is drawn on time. You should also avoid exercising during the test, because exercise can alter test results. PREPARATION FOR TEST  Eat normally for 3 days prior to the GTT test, including having plenty of carbohydrate-rich foods. Do not eat or drink anything except water during the final 12 hours before the test. In addition, your health care provider may ask you to stop taking certain medicines before the test. RESULTS  It is your responsibility to obtain your test results. Ask the lab or department performing the test when and how you will get your results. Contact your health care provider to discuss any questions you have about your results.  Range of Normal Values Ranges for normal values may vary among different labs and hospitals. You should always check  with your health care provider after having lab work or other tests done to discuss whether your values are considered within normal limits. Normal levels of blood glucose are as follows:  Fasting: less than 105 mg/dL.   1 hour after drinking the solution: less than 190 mg/dL.   2 hours after drinking the solution: less than 165 mg/dL.   3 hours after drinking the solution: less than 145 mg/dL.  Some substances can interfere with GTT results. These may include:  Blood pressure and heart failure medicines, including beta blockers, furosemide, and thiazides.   Anti-inflammatory medicines, including aspirin.   Nicotine.   Some psychiatric medicines.  Meaning of Results Outside Normal Value Ranges GTT test results that are above normal values may indicate a number of health problems, such as:   Gestational diabetes.   Acute stress response.   Cushing syndrome.   Tumors such as pheochromocytoma or glucagonoma.   Long-term kidney problems.   Pancreatitis.   Hyperthyroidism.   Current infection.  Discuss your test results with your health care provider. He or she will use the results to make a diagnosis and determine a treatment plan that is right for you.   This information is not intended to replace advice given to you by your health care provider. Make sure you discuss any questions you have with your health care provider.   Document Released: 03/20/2012 Document Revised: 10/10/2014 Document Reviewed: 01/24/2014 Elsevier Interactive Patient Education [employer].

## 2015-10-20 LAB — RPR

## 2015-10-20 LAB — GLUCOSE TOLERANCE, 1 HOUR (50G) W/O FASTING: Glucose, 1 Hour GTT: 108 mg/dL (ref 70–140)

## 2015-10-20 LAB — HIV ANTIBODY (ROUTINE TESTING W REFLEX): HIV 1&2 Ab, 4th Generation: NONREACTIVE

## 2015-11-02 ENCOUNTER — Encounter: Payer: Self-pay | Admitting: Obstetrics & Gynecology

## 2015-11-02 ENCOUNTER — Ambulatory Visit (INDEPENDENT_AMBULATORY_CARE_PROVIDER_SITE_OTHER): Payer: Medicaid Other | Admitting: Obstetrics & Gynecology

## 2015-11-02 VITALS — BP 109/61 | HR 101 | Wt 200.0 lb

## 2015-11-02 DIAGNOSIS — O99213 Obesity complicating pregnancy, third trimester: Secondary | ICD-10-CM

## 2015-11-02 DIAGNOSIS — F329 Major depressive disorder, single episode, unspecified: Secondary | ICD-10-CM

## 2015-11-02 DIAGNOSIS — F32A Depression, unspecified: Secondary | ICD-10-CM

## 2015-11-02 DIAGNOSIS — Z3403 Encounter for supervision of normal first pregnancy, third trimester: Secondary | ICD-10-CM

## 2015-11-02 DIAGNOSIS — Z820 Family history of epilepsy and other diseases of the nervous system: Secondary | ICD-10-CM

## 2015-11-02 DIAGNOSIS — E669 Obesity, unspecified: Secondary | ICD-10-CM

## 2015-11-02 DIAGNOSIS — O99343 Other mental disorders complicating pregnancy, third trimester: Secondary | ICD-10-CM

## 2015-11-02 NOTE — Progress Notes (Signed)
Pt reports decreased volume when voiding.  UA normal.  Pt voided 30cc for UA  Chantale Leugers L. Harraway-Smith, M.D., Cherlynn June

## 2015-11-02 NOTE — Progress Notes (Signed)
Patient complaining that it is hard to urinate. She is able to do a "little bit" every 24min - 1 hour. Patient was also started on Zoloft and states that 1/2 tablet doesn't help her mood but that amount doesn't "hurt my stomach", but when she increases it to 1 tablet she states it helps her mood but makes her stomach hurt. Kathrene Alu RN BSN

## 2015-11-02 NOTE — Patient Instructions (Signed)
Breastfeeding and Inducing Lactation Induced lactation is using hormones or other medicines and breast stimulation to help you produce breast milk. You may want to try induced lactation if you:  Are adopting a baby.   Are having a surrogate mother carry your baby.   Have to stop breastfeeding for a period of time.  HOW DOES IT WORK? During pregnancy, your hormones change to prepare your body to produce breast milk. After pregnancy, hormones signal your body to start making breast milk to feed your baby. When you do not go through these changes, it may be hard for you to produce enough breast milk to feed your baby. Your health care provider and a lactation consultant can help you produce milk using medicine and breast stimulation.  WILL I MAKE ENOUGH MILK TO FEED MY BABY? Induced lactation may be successful. However, very few women who use induced lactation to produce breast milk can make all the milk their baby needs. You may need to feed your baby with donated breast milk or infant formula in addition to your breast milk. This will make sure your baby gets adequate nutrition. Induced lactation is usually more successful if you have been pregnant before.  HOW DOES MY BODY PRODUCE MILK? To induce lactation, you will start taking hormones 3-4 months before you want to start breastfeeding. You will continue to take them until about 6 weeks before the baby arrives. When you stop taking the hormones, you will need to perform breast stimulation a number of times per day to encourage breast milk production. Breast stimulation can be performed by gently rubbing and stretching the nipple tissue. Breast stimulation can also be performed using a double electric hospital-grade pump to mimic a baby's suckling at the breast. Try to pump every 3 hours (8 times a day) for 20 minutes on each breast. If you are unable to pump that many times each day, do it as often as possible. This helps your body continue to make  milk. When you put your baby to your breast, your body may also respond to the smell, sound, and feel of your baby by increasing the amount of milk you produce.  Your health care provider may also prescribe medicine to stimulate lactation and increase your milk supply. Herbal medicines are also available to try to induce lactation. It is important to know that these medicines are not approved or regulated by the FDA. Always check with your health care provider before using any herbal medicine. WHAT ELSE DO I NEED TO KNOW?  You should only take hormones and medicines as directed by your health care provider or trained lactation consultant.  Talk to your health care provider or lactation consultant if you need guidance. He or she may be able to help you start a milk supply and advise you as you make important decisions about nourishing your baby.  Try using a supplemental nursing system to provide extra donated breast milk or formula at the breast while the baby nurses. This ensures that your infant gets enough nutrition while you are breastfeeding. Ask a lactation specialist to help you find and use this device.   This information is not intended to replace advice given to you by your health care provider. Make sure you discuss any questions you have with your health care provider.   Document Released: 09/19/2005 Document Revised: 09/24/2013 Document Reviewed: 07/12/2013 Elsevier Interactive Patient Education [employer].

## 2015-11-02 NOTE — Progress Notes (Signed)
Subjective:  Emily Villanueva is a 20 y.o. G1P0 at 107w0d being seen today for ongoing prenatal care.  She is currently monitored for the following issues for this low-risk pregnancy and has Supervision of normal first pregnancy, antepartum; Family history of movement disorder; Obesity in pregnancy, antepartum; and Depression complicating pregnancy, antepartum on her problem list. Pt reports voiding small amounts at a time.  Patient reports abd pain if she takes the entire dose of Zoloft at one time.  She reports that if she only takes 1/2 tab she doesn't get the same relief.  She has not tried dividing the tablets bid .Contractions: Not present. Vag. Bleeding: None.  Movement: Present. Denies leaking of fluid.   The following portions of the patient's history were reviewed and updated as appropriate: allergies, current medications, past family history, past medical history, past social history, past surgical history and problem list. Problem list updated.  Objective:   Filed Vitals:   11/02/15 0950  BP: 109/61  Pulse: 101  Weight: 200 lb (90.719 kg)    Fetal Status: Fetal Heart Rate (bpm): 140   Movement: Present     General:  Alert, oriented and cooperative. Patient is in no acute distress.  Skin: Skin is warm and dry. No rash noted.   Cardiovascular: Normal heart rate noted  Respiratory: Normal respiratory effort, no problems with respiration noted  Abdomen: Soft, gravid, appropriate for gestational age. Pain/Pressure: Present     Pelvic: Vag. Bleeding: None Vag D/C Character: Thin   Cervical exam deferred        Extremities: Normal range of motion.  Edema: None  Mental Status: Normal mood and affect. Normal behavior. Normal judgment and thought content.   Urinalysis: Urine Protein: Negative Urine Glucose: Negative  Assessment and Plan:  Pregnancy: G1P0 at [redacted]w[redacted]d  1. Supervision of normal first pregnancy, antepartum, third trimester  2. Depression complicating pregnancy, antepartum,  third trimester Rec continued Zoloft but with 25mg  bid  3. Family history of movement disorder  4. Obesity in pregnancy, antepartum, third trimester  5. Urinary sx- no signs of UTI.  Rec elevating bladder prior to voiding.  Preterm labor symptoms and general obstetric precautions including but not limited to vaginal bleeding, contractions, leaking of fluid and fetal movement were reviewed in detail with the patient. Please refer to After Visit Summary for other counseling recommendations.  Return in about 2 weeks (around 11/16/2015).   Lavonia Drafts, MD

## 2015-11-16 ENCOUNTER — Ambulatory Visit (INDEPENDENT_AMBULATORY_CARE_PROVIDER_SITE_OTHER): Payer: Medicaid Other | Admitting: Obstetrics & Gynecology

## 2015-11-16 VITALS — BP 127/61 | HR 108 | Wt 199.0 lb

## 2015-11-16 DIAGNOSIS — O99213 Obesity complicating pregnancy, third trimester: Secondary | ICD-10-CM

## 2015-11-16 DIAGNOSIS — O99343 Other mental disorders complicating pregnancy, third trimester: Secondary | ICD-10-CM

## 2015-11-16 DIAGNOSIS — R319 Hematuria, unspecified: Secondary | ICD-10-CM

## 2015-11-16 DIAGNOSIS — Z3403 Encounter for supervision of normal first pregnancy, third trimester: Secondary | ICD-10-CM

## 2015-11-16 DIAGNOSIS — F329 Major depressive disorder, single episode, unspecified: Secondary | ICD-10-CM

## 2015-11-16 DIAGNOSIS — E669 Obesity, unspecified: Secondary | ICD-10-CM

## 2015-11-16 NOTE — Progress Notes (Signed)
Subjective:  Emily Villanueva is a 20 y.o. MW G1P0 at [redacted]w[redacted]d being seen today for ongoing prenatal care.  She is currently monitored for the following issues for this low-risk pregnancy and has Supervision of normal first pregnancy, antepartum; Family history of movement disorder; Obesity in pregnancy, antepartum; and Depression complicating pregnancy, antepartum on her problem list.  Patient reports general aches and pains of pregnnacy, allergy symptoms..  Contractions: Not present. Vag. Bleeding: None.  Movement: Present. Denies leaking of fluid.   The following portions of the patient's history were reviewed and updated as appropriate: allergies, current medications, past family history, past medical history, past social history, past surgical history and problem list. Problem list updated.  Objective:   Filed Vitals:   11/16/15 1004  BP: 127/61  Pulse: 108  Weight: 199 lb (90.266 kg)    Fetal Status: Fetal Heart Rate (bpm): 145   Movement: Present     General:  Alert, oriented and cooperative. Patient is in no acute distress.  Skin: Skin is warm and dry. No rash noted.   Cardiovascular: Normal heart rate noted  Respiratory: Normal respiratory effort, no problems with respiration noted  Abdomen: Soft, gravid, appropriate for gestational age. Pain/Pressure: Present     Pelvic: Vag. Bleeding: None Vag D/C Character: Thin   Closed/thick/high  Extremities: Normal range of motion.  Edema: None  Mental Status: Normal mood and affect. Normal behavior. Normal judgment and thought content.   Urinalysis: Urine Protein: Negative Urine Glucose: Negative  Assessment and Plan:  Pregnancy: G1P0 at [redacted]w[redacted]d  1. Obesity in pregnancy, antepartum, third trimester   2. Depression complicating pregnancy, antepartum, third trimester   3. Supervision of normal first pregnancy, antepartum, third trimester - Rec that she resume her Claritin prn  Preterm labor symptoms and general obstetric precautions  including but not limited to vaginal bleeding, contractions, leaking of fluid and fetal movement were reviewed in detail with the patient. Please refer to After Visit Summary for other counseling recommendations.  Return in about 3 weeks (around 12/07/2015).   Emily Filbert, MD

## 2015-11-17 LAB — CULTURE, URINE COMPREHENSIVE
Colony Count: NO GROWTH
Organism ID, Bacteria: NO GROWTH

## 2015-12-09 ENCOUNTER — Encounter: Payer: Medicaid Other | Admitting: Obstetrics & Gynecology

## 2015-12-10 ENCOUNTER — Encounter (HOSPITAL_COMMUNITY): Payer: Self-pay | Admitting: *Deleted

## 2015-12-10 ENCOUNTER — Inpatient Hospital Stay (HOSPITAL_COMMUNITY)
Admission: AD | Admit: 2015-12-10 | Discharge: 2015-12-10 | Disposition: A | Discharge status: Home / Self Care | Payer: Medicaid Other | Source: Ambulatory Visit | Attending: Obstetrics & Gynecology | Admitting: Obstetrics & Gynecology

## 2015-12-10 DIAGNOSIS — M419 Scoliosis, unspecified: Secondary | ICD-10-CM | POA: Insufficient documentation

## 2015-12-10 DIAGNOSIS — Z3A35 35 weeks gestation of pregnancy: Secondary | ICD-10-CM | POA: Diagnosis not present

## 2015-12-10 DIAGNOSIS — O4703 False labor before 37 completed weeks of gestation, third trimester: Secondary | ICD-10-CM | POA: Diagnosis not present

## 2015-12-10 DIAGNOSIS — O99343 Other mental disorders complicating pregnancy, third trimester: Secondary | ICD-10-CM

## 2015-12-10 DIAGNOSIS — Z87891 Personal history of nicotine dependence: Secondary | ICD-10-CM | POA: Diagnosis not present

## 2015-12-10 DIAGNOSIS — F329 Major depressive disorder, single episode, unspecified: Secondary | ICD-10-CM

## 2015-12-10 DIAGNOSIS — F32A Depression, unspecified: Secondary | ICD-10-CM

## 2015-12-10 DIAGNOSIS — Z820 Family history of epilepsy and other diseases of the nervous system: Secondary | ICD-10-CM

## 2015-12-10 DIAGNOSIS — O99213 Obesity complicating pregnancy, third trimester: Secondary | ICD-10-CM

## 2015-12-10 DIAGNOSIS — O479 False labor, unspecified: Secondary | ICD-10-CM

## 2015-12-10 NOTE — Discharge Instructions (Signed)
Braxton Hicks Contractions °Contractions of the uterus can occur throughout pregnancy. Contractions are not always a sign that you are in labor.  °WHAT ARE BRAXTON HICKS CONTRACTIONS?  °Contractions that occur before labor are called Braxton Hicks contractions, or false labor. Toward the end of pregnancy (32-34 weeks), these contractions can develop more often and may become more forceful. This is not true labor because these contractions do not result in opening (dilatation) and thinning of the cervix. They are sometimes difficult to tell apart from true labor because these contractions can be forceful and people have different pain tolerances. You should not feel embarrassed if you go to the hospital with false labor. Sometimes, the only way to tell if you are in true labor is for your health care provider to look for changes in the cervix. °If there are no prenatal problems or other health problems associated with the pregnancy, it is completely safe to be sent home with false labor and await the onset of true labor. °HOW CAN YOU TELL THE DIFFERENCE BETWEEN TRUE AND FALSE LABOR? °False Labor °· The contractions of false labor are usually shorter and not as hard as those of true labor.   °· The contractions are usually irregular.   °· The contractions are often felt in the front of the lower abdomen and in the groin.   °· The contractions may go away when you walk around or change positions while lying down.   °· The contractions get weaker and are shorter lasting as time goes on.   °· The contractions do not usually become progressively stronger, regular, and closer together as with true labor.   °True Labor °· Contractions in true labor last 30-70 seconds, become very regular, usually become more intense, and increase in frequency.   °· The contractions do not go away with walking.   °· The discomfort is usually felt in the top of the uterus and spreads to the lower abdomen and low back.   °· True labor can be  determined by your health care provider with an exam. This will show that the cervix is dilating and getting thinner.   °WHAT TO REMEMBER °· Keep up with your usual exercises and follow other instructions given by your health care provider.   °· Take medicines as directed by your health care provider.   °· Keep your regular prenatal appointments.   °· Eat and drink lightly if you think you are going into labor.   °· If Braxton Hicks contractions are making you uncomfortable:   °¨ Change your position from lying down or resting to walking, or from walking to resting.   °¨ Sit and rest in a tub of warm water.   °¨ Drink 2-3 glasses of water. Dehydration may cause these contractions.   °¨ Do slow and deep breathing several times an hour.   °WHEN SHOULD I SEEK IMMEDIATE MEDICAL CARE? °Seek immediate medical care if: °· Your contractions become stronger, more regular, and closer together.   °· You have fluid leaking or gushing from your vagina.   °· You have a fever.   °· You pass blood-tinged mucus.   °· You have vaginal bleeding.   °· You have continuous abdominal pain.   °· You have low back pain that you never had before.   °· You feel your baby's head pushing down and causing pelvic pressure.   °· Your baby is not moving as much as it used to.   °  °This information is not intended to replace advice given to you by your health care provider. Make sure you discuss any questions you have with your health care   provider. °  °Document Released: 09/19/2005 Document Revised: 09/24/2013 Document Reviewed: 07/01/2013 °Elsevier Interactive Patient Education ©2016 [employer] ° °

## 2015-12-10 NOTE — MAU Note (Signed)
PT  SAYS SHE STARTED  HURTING  AT 623PM.       PNC-     WILLARD  DAIRY   .   DENIES   HSV AND MRSA.    GBS-  NOT  COLLECTED  YET

## 2015-12-10 NOTE — MAU Provider Note (Signed)
None     Chief Complaint: contractions  Kriya Jan is  20 y.o. G1P0 at [redacted]w[redacted]d presents complaining having a contraction at 1823, 1829, 1843, and now decreasing in frequency, but feel a little stronger when she has one.  Denies bleeding, ROM. Fetus is active.    Obstetrical/Gynecological History: OB History    Gravida Para Term Preterm AB TAB SAB Ectopic Multiple Living   1              Past Medical History: Past Medical History  Diagnosis Date  . Scoliosis   . Scoliosis     Past Surgical History: Past Surgical History  Procedure Laterality Date  . Tonsillectomy      Family History: Family History  Problem Relation Age of Onset  . Stroke Mother     Social History: Social History  Substance Use Topics  . Smoking status: Former Smoker -- 1.50 packs/day for 3 years  . Smokeless tobacco: None  . Alcohol Use: No    Allergies:  Allergies  Allergen Reactions  . Gluten Meal Hives  . Lactose Intolerance (Gi) Hives  . Peanuts [Peanut Oil] Hives  . Shrimp [Shellfish Allergy] Hives  . Wheat Bran Hives    Meds:  Prescriptions prior to admission  Medication Sig Dispense Refill Last Dose  . acetaminophen (TYLENOL) 500 MG tablet Take 500 mg by mouth every 6 (six) hours as needed for moderate pain.    Past Week at Unknown time  . loratadine-pseudoephedrine (CLARITIN-D 24-HOUR) 10-240 MG 24 hr tablet Take 1 tablet by mouth daily.   12/09/2015 at Unknown time  . Prenat Vit-Fe Gly Cys-FA-Omega (ENBRACE HR) CAPS Take 1 capsule by mouth daily.   5 12/09/2015 at Unknown time  . sertraline (ZOLOFT) 50 MG tablet Take 1 tablet (50 mg total) by mouth daily. 30 tablet 2 12/09/2015 at Unknown time    Review of Systems   Constitutional: Negative for fever and chills Eyes: Negative for visual disturbances Respiratory: Negative for shortness of breath, dyspnea Cardiovascular: Negative for chest pain or palpitations  Gastrointestinal: Negative for vomiting, diarrhea and  constipation Genitourinary: Negative for dysuria and urgency Musculoskeletal: Negative for back pain, joint pain, myalgias.  Normal ROM  Neurological: Negative for dizziness and headaches    Physical Exam  Blood pressure 122/71, pulse 113, last menstrual period 04/06/2015. GENERAL: Well-developed, well-nourished female in no acute distress.  LUNGS: Clear to auscultation bilaterally.  HEART: Regular rate and rhythm. ABDOMEN: Soft, nontender, nondistended, gravid.  EXTREMITIES: Nontender, no edema, 2+ distal pulses. DTR's 2+ CERVICAL EXAM: Dilatation 0.5cm   Effacement 50%   Station -2   Presentation: cephalic FHT:  Baseline rate 145 bpm   Variability moderate  Accelerations present   Decelerations none Contractions: rare, irregular   Labs: No results found for this or any previous visit (from the past 24 hour(s)). Imaging Studies:  No results found.  Assessment: Marquerite Heggs is  20 y.o. G1P0 at [redacted]w[redacted]d presents with [address] Circle.  Plan: DC home. Labor precautions  CRESENZO-DISHMAN,Alvia Tory 3/9/20179:14 PM

## 2015-12-21 ENCOUNTER — Telehealth: Payer: Self-pay

## 2015-12-21 NOTE — Telephone Encounter (Signed)
Patient called stating that she has rescheduled her missed appointment. Patient states since this morning when she is going to the bathroom she has had a little more pressure and some white mucous discharge. Patient states baby is moving well and denies any bleeding or leaking of clear fluid. Patient made aware that if pressure continues and she feels like it is getting stronger she should go to [place] Admission unit to be checked.  Patient instructed if she has contractions/ pressure increase, bleeding, or leaking of fluid - these are all reasons to go to [facility] Admissions Unit. Patient states understanding. Kathrene Alu RN BSN

## 2015-12-22 ENCOUNTER — Inpatient Hospital Stay (HOSPITAL_COMMUNITY): Payer: Medicaid Other | Admitting: Anesthesiology

## 2015-12-22 ENCOUNTER — Telehealth: Payer: Self-pay

## 2015-12-22 ENCOUNTER — Encounter (HOSPITAL_COMMUNITY): Payer: Self-pay | Admitting: *Deleted

## 2015-12-22 ENCOUNTER — Inpatient Hospital Stay (HOSPITAL_COMMUNITY)
Admission: AD | Admit: 2015-12-22 | Discharge: 2015-12-25 | DRG: 775 | Disposition: A | Discharge status: Home / Self Care | Payer: Medicaid Other | Source: Ambulatory Visit | Attending: Obstetrics & Gynecology | Admitting: Obstetrics & Gynecology

## 2015-12-22 ENCOUNTER — Inpatient Hospital Stay (HOSPITAL_COMMUNITY)
Admission: AD | Admit: 2015-12-22 | Discharge: 2015-12-22 | Disposition: A | Discharge status: Home / Self Care | Payer: Medicaid Other | Source: Ambulatory Visit | Attending: Obstetrics & Gynecology | Admitting: Obstetrics & Gynecology

## 2015-12-22 DIAGNOSIS — O99213 Obesity complicating pregnancy, third trimester: Secondary | ICD-10-CM

## 2015-12-22 DIAGNOSIS — Z6828 Body mass index (BMI) 28.0-28.9, adult: Secondary | ICD-10-CM

## 2015-12-22 DIAGNOSIS — O99344 Other mental disorders complicating childbirth: Secondary | ICD-10-CM | POA: Diagnosis present

## 2015-12-22 DIAGNOSIS — O99214 Obesity complicating childbirth: Secondary | ICD-10-CM | POA: Diagnosis present

## 2015-12-22 DIAGNOSIS — F329 Major depressive disorder, single episode, unspecified: Secondary | ICD-10-CM | POA: Diagnosis present

## 2015-12-22 DIAGNOSIS — E669 Obesity, unspecified: Secondary | ICD-10-CM | POA: Diagnosis present

## 2015-12-22 DIAGNOSIS — Z823 Family history of stroke: Secondary | ICD-10-CM

## 2015-12-22 DIAGNOSIS — Z3A37 37 weeks gestation of pregnancy: Secondary | ICD-10-CM

## 2015-12-22 DIAGNOSIS — Z3403 Encounter for supervision of normal first pregnancy, third trimester: Secondary | ICD-10-CM

## 2015-12-22 DIAGNOSIS — Z820 Family history of epilepsy and other diseases of the nervous system: Secondary | ICD-10-CM

## 2015-12-22 DIAGNOSIS — O99354 Diseases of the nervous system complicating childbirth: Secondary | ICD-10-CM | POA: Diagnosis present

## 2015-12-22 DIAGNOSIS — O4202 Full-term premature rupture of membranes, onset of labor within 24 hours of rupture: Secondary | ICD-10-CM | POA: Diagnosis present

## 2015-12-22 DIAGNOSIS — M419 Scoliosis, unspecified: Secondary | ICD-10-CM | POA: Diagnosis present

## 2015-12-22 DIAGNOSIS — Z87891 Personal history of nicotine dependence: Secondary | ICD-10-CM | POA: Diagnosis not present

## 2015-12-22 DIAGNOSIS — O4292 Full-term premature rupture of membranes, unspecified as to length of time between rupture and onset of labor: Secondary | ICD-10-CM | POA: Diagnosis not present

## 2015-12-22 DIAGNOSIS — O99343 Other mental disorders complicating pregnancy, third trimester: Secondary | ICD-10-CM

## 2015-12-22 DIAGNOSIS — O479 False labor, unspecified: Secondary | ICD-10-CM

## 2015-12-22 DIAGNOSIS — IMO0001 Reserved for inherently not codable concepts without codable children: Secondary | ICD-10-CM

## 2015-12-22 LAB — CBC
HCT: 35 % — ABNORMAL LOW (ref 36.0–46.0)
HEMOGLOBIN: 11.5 g/dL — AB (ref 12.0–15.0)
MCH: 28.4 pg (ref 26.0–34.0)
MCHC: 32.9 g/dL (ref 30.0–36.0)
MCV: 86.4 fL (ref 78.0–100.0)
PLATELETS: 231 10*3/uL (ref 150–400)
RBC: 4.05 MIL/uL (ref 3.87–5.11)
RDW: 15 % (ref 11.5–15.5)
WBC: 12 10*3/uL — AB (ref 4.0–10.5)

## 2015-12-22 LAB — URINALYSIS, ROUTINE W REFLEX MICROSCOPIC
BILIRUBIN URINE: NEGATIVE
Glucose, UA: NEGATIVE mg/dL
KETONES UR: NEGATIVE mg/dL
NITRITE: NEGATIVE
PROTEIN: NEGATIVE mg/dL
SPECIFIC GRAVITY, URINE: 1.01 (ref 1.005–1.030)
pH: 5.5 (ref 5.0–8.0)

## 2015-12-22 LAB — TYPE AND SCREEN
ABO/RH(D): A POS
Antibody Screen: NEGATIVE

## 2015-12-22 LAB — URINE MICROSCOPIC-ADD ON

## 2015-12-22 LAB — GROUP B STREP BY PCR: Group B strep by PCR: NEGATIVE

## 2015-12-22 LAB — OB RESULTS CONSOLE GBS: STREP GROUP B AG: NEGATIVE

## 2015-12-22 LAB — POCT FERN TEST: POCT Fern Test: POSITIVE

## 2015-12-22 MED ORDER — SERTRALINE HCL 50 MG PO TABS
50.0000 mg | ORAL_TABLET | Freq: Every day | ORAL | Status: DC
  Administered 2015-12-23 – 2015-12-25 (×3): 50 mg via ORAL
  Filled 2015-12-22 (×5): qty 1

## 2015-12-22 MED ORDER — OXYCODONE-ACETAMINOPHEN 5-325 MG PO TABS
1.0000 | ORAL_TABLET | ORAL | Status: DC | PRN
Start: 2015-12-22 — End: 2015-12-23

## 2015-12-22 MED ORDER — FLEET ENEMA 7-19 GM/118ML RE ENEM: 1.0000 | ENEMA | RECTAL | Status: DC | PRN

## 2015-12-22 MED ORDER — OXYTOCIN BOLUS FROM INFUSION
500.0000 mL | INTRAVENOUS | Status: DC
  Administered 2015-12-23: 500 mL via INTRAVENOUS

## 2015-12-22 MED ORDER — LIDOCAINE HCL (PF) 1 % IJ SOLN
30.0000 mL | INTRAMUSCULAR | Status: DC | PRN
  Filled 2015-12-22: qty 30

## 2015-12-22 MED ORDER — EPHEDRINE 5 MG/ML INJ
10.0000 mg | INTRAVENOUS | Status: DC | PRN
  Filled 2015-12-22: qty 2

## 2015-12-22 MED ORDER — LORATADINE 10 MG PO TABS
10.0000 mg | ORAL_TABLET | Freq: Every day | ORAL | Status: DC
  Filled 2015-12-22: qty 1

## 2015-12-22 MED ORDER — DIPHENHYDRAMINE HCL 50 MG/ML IJ SOLN
12.5000 mg | INTRAMUSCULAR | Status: AC | PRN
  Administered 2015-12-22 (×3): 12.5 mg via INTRAVENOUS
  Filled 2015-12-22: qty 1

## 2015-12-22 MED ORDER — OXYTOCIN 10 UNIT/ML IJ SOLN
2.5000 [IU]/h | INTRAVENOUS | Status: DC
  Filled 2015-12-22: qty 10

## 2015-12-22 MED ORDER — LIDOCAINE HCL (PF) 1 % IJ SOLN
INTRAMUSCULAR | Status: DC | PRN
  Administered 2015-12-22: 6 mL
  Administered 2015-12-22: 5 mL

## 2015-12-22 MED ORDER — ONDANSETRON HCL 4 MG/2ML IJ SOLN
4.0000 mg | Freq: Four times a day (QID) | INTRAMUSCULAR | Status: DC | PRN
  Administered 2015-12-22: 4 mg via INTRAVENOUS
  Filled 2015-12-22: qty 2

## 2015-12-22 MED ORDER — LACTATED RINGERS IV SOLN: 500.0000 mL | Freq: Once | INTRAVENOUS | Status: DC

## 2015-12-22 MED ORDER — PSEUDOEPHEDRINE HCL ER 120 MG PO TB12
120.0000 mg | ORAL_TABLET | Freq: Two times a day (BID) | ORAL | Status: DC
  Filled 2015-12-22: qty 1

## 2015-12-22 MED ORDER — OXYCODONE-ACETAMINOPHEN 5-325 MG PO TABS
2.0000 | ORAL_TABLET | ORAL | Status: DC | PRN
  Filled 2015-12-22: qty 2

## 2015-12-22 MED ORDER — LACTATED RINGERS IV SOLN
500.0000 mL | Freq: Once | INTRAVENOUS | Status: AC
  Administered 2015-12-23: 500 mL via INTRAVENOUS

## 2015-12-22 MED ORDER — LACTATED RINGERS IV SOLN: 500.0000 mL | INTRAVENOUS | Status: DC | PRN

## 2015-12-22 MED ORDER — EPHEDRINE 5 MG/ML INJ
10.0000 mg | INTRAVENOUS | Status: DC | PRN
  Administered 2015-12-23: 10 mg via INTRAVENOUS
  Filled 2015-12-22: qty 2

## 2015-12-22 MED ORDER — ACETAMINOPHEN 325 MG PO TABS: 650.0000 mg | ORAL_TABLET | ORAL | Status: DC | PRN

## 2015-12-22 MED ORDER — PHENYLEPHRINE 40 MCG/ML (10ML) SYRINGE FOR IV PUSH (FOR BLOOD PRESSURE SUPPORT)
80.0000 ug | PREFILLED_SYRINGE | INTRAVENOUS | Status: DC | PRN
Start: 2015-12-22 — End: 2015-12-23
  Filled 2015-12-22: qty 2
  Filled 2015-12-22 (×2): qty 20

## 2015-12-22 MED ORDER — LACTATED RINGERS IV SOLN
INTRAVENOUS | Status: DC
  Administered 2015-12-22: 20:00:00 via INTRAVENOUS

## 2015-12-22 MED ORDER — FENTANYL 2.5 MCG/ML BUPIVACAINE 1/10 % EPIDURAL INFUSION (WH - ANES)
INTRAMUSCULAR | Status: AC
Start: 2015-12-22 — End: 2015-12-22
  Administered 2015-12-22: 14 mL/h via EPIDURAL
  Filled 2015-12-22: qty 125

## 2015-12-22 MED ORDER — PHENYLEPHRINE 40 MCG/ML (10ML) SYRINGE FOR IV PUSH (FOR BLOOD PRESSURE SUPPORT)
80.0000 ug | PREFILLED_SYRINGE | INTRAVENOUS | Status: DC | PRN
  Administered 2015-12-23: 80 ug via INTRAVENOUS
  Filled 2015-12-22: qty 2

## 2015-12-22 MED ORDER — PHENYLEPHRINE 40 MCG/ML (10ML) SYRINGE FOR IV PUSH (FOR BLOOD PRESSURE SUPPORT)
PREFILLED_SYRINGE | INTRAVENOUS | Status: AC
  Filled 2015-12-22: qty 20

## 2015-12-22 MED ORDER — FENTANYL 2.5 MCG/ML BUPIVACAINE 1/10 % EPIDURAL INFUSION (WH - ANES)
14.0000 mL/h | INTRAMUSCULAR | Status: DC | PRN
  Administered 2015-12-22 – 2015-12-23 (×3): 14 mL/h via EPIDURAL
  Filled 2015-12-22: qty 125

## 2015-12-22 MED ORDER — LORATADINE-PSEUDOEPHEDRINE ER 10-240 MG PO TB24: 1.0000 | ORAL_TABLET | Freq: Every day | ORAL | Status: DC

## 2015-12-22 MED ORDER — CITRIC ACID-SODIUM CITRATE 334-500 MG/5ML PO SOLN: 30.0000 mL | ORAL | Status: DC | PRN

## 2015-12-22 NOTE — MAU Note (Signed)
Work up every hour feeling like i had to pee but only went alittle. Some pain in lower abd but don't think it's labor pains. Had bright red on pad last night when wiped but now is brown./yellow. Denies LOF. Have not felt FM since last night about 2100. Lots of pelvic pressure

## 2015-12-22 NOTE — Telephone Encounter (Signed)
Patient called and states that she has had a mucous discharge with some blood in it. Patient states that she has had an increase in pressure and the feeling is a alittle stronger every two- three minutes. Patient instructed to have someone drive her to [facility] to [facility] admissions unit for evaluation. Kathrene Alu RN BSN

## 2015-12-22 NOTE — Progress Notes (Signed)
Pt alittle more relaxed. Spouse at bedside and supportive

## 2015-12-22 NOTE — MAU Provider Note (Signed)
History     CSN: [id number] Arrival date and time: 12/22/15 1125 First Provider Initiated Contact with Patient 12/22/15 1310      Chief Complaint  Patient presents with  . Decreased Fetal Movement  . Abdominal Pain  . Vaginal Bleeding   HPI  Emily Villanueva is a 20 yo G1P0 at [redacted]w[redacted]d who presents with lower abdominal pain since 0300 this morning. Patient states that she feels like this pain is different from braxton hicks. Pain is described as lots of pressure and "stomach ripping open". The pain is intermittent but patient feels like it is getting worse. Denies N/V/D, maybe had some GERD. Denies dysuria. Pain improves with holding her "belly up." Pain worse with sitting straight up.  Hasn't felt baby move since last night. Admits to some vaginal bleeding (tinge of bright red blood last night when she wiped after urinating and now brownish yellow fluid this morning). Denies LOF other than stated above.   Past Medical History  Diagnosis Date  . Scoliosis   . Scoliosis     Past Surgical History  Procedure Laterality Date  . Tonsillectomy      Family History  Problem Relation Age of Onset  . Stroke Mother     Social History  Substance Use Topics  . Smoking status: Former Smoker -- 1.50 packs/day for 3 years  . Smokeless tobacco: None  . Alcohol Use: No    Allergies:  Allergies  Allergen Reactions  . Gluten Meal Hives  . Lactose Intolerance (Gi) Hives  . Peanuts [Peanut Oil] Hives  . Shrimp [Shellfish Allergy] Hives  . Wheat Bran Hives    Prescriptions prior to admission  Medication Sig Dispense Refill Last Dose  . loratadine-pseudoephedrine (CLARITIN-D 24-HOUR) 10-240 MG 24 hr tablet Take 1 tablet by mouth daily.   12/22/2015 at Unknown time  . Prenat Vit-Fe Gly Cys-FA-Omega (ENBRACE HR) CAPS Take 1 capsule by mouth daily.   5 12/22/2015 at Unknown time  . sertraline (ZOLOFT) 50 MG tablet Take 1 tablet (50 mg total) by mouth daily. 30 tablet 2 12/22/2015 at Unknown time   . acetaminophen (TYLENOL) 500 MG tablet Take 500 mg by mouth every 6 (six) hours as needed for moderate pain.    prn    Review of Systems  Constitutional: Negative for fever.  HENT: Positive for congestion. Negative for sore throat.   Eyes: Negative for blurred vision.  Respiratory: Negative for cough.   Cardiovascular: Negative for chest pain, palpitations and leg swelling.  Gastrointestinal: Positive for heartburn and abdominal pain. Negative for nausea, vomiting, diarrhea, constipation and blood in stool.  Genitourinary: Positive for frequency. Negative for dysuria, urgency and hematuria.  Musculoskeletal: Positive for back pain.  Skin: Negative for rash.  Neurological: Positive for headaches. Negative for dizziness.   Physical Exam   Last menstrual period 04/06/2015.  Physical Exam  Constitutional: She is oriented to person, place, and time. She appears well-developed and well-nourished.  HENT:  Head: Normocephalic and atraumatic.  Eyes: Pupils are equal, round, and reactive to light.  Cardiovascular: Normal rate, regular rhythm, normal heart sounds and intact distal pulses.   Respiratory: Effort normal and breath sounds normal. No respiratory distress.  GI: Bowel sounds are normal. She exhibits distension (gravid abdomen). There is no tenderness.  Musculoskeletal: She exhibits no edema.  Neurological: She is alert and oriented to person, place, and time.  Skin: Skin is warm and dry. No rash noted.  Lesion noted on belly button where patient picked skin  Psychiatric: Her speech is normal. Judgment normal. Her mood appears anxious.  Dilation: 1.5 Effacement (%): 50 Cervical Position: Posterior Station: -2 Presentation: Vertex Exam by:: Blima Singer RNC  MAU Course  Procedures  MDM Vital signs stable and physical exam unremarkable for acute abdomen. No abdominal tenderness to palpation; unlikely cholecystitis or appendicitis. Unlikely UTI; No suprapubic tenderness,  dysuria, and UA shows few bacteria and small leuks (likely unclean catch as there are 6-30 squamous cells present, will not send urine for culture). Patient was intermittently tachycardic and appeared anxious about having a UTI. FHT reactive. Unlikely labor pains as no LOF, cervical exam revealed 1.5/50/-2, and UC irregular on monitor.  Assessment and Plan  Emily Villanueva is a 20 yo G1P0 at [redacted]w[redacted]d who presents with lower abdominal pain x 1 day.   Braxton- Hicks contractions - Explained BH contractions to patient and difference between [facility] contractions vs laboring contractions - Return precautions given - Tylenol PRN  Carlyle Dolly 12/22/2015, 1:12 PM   OB fellow attestation:  I have seen and examined this patient; I agree with above documentation in the resident's note.   Emily Villanueva is a 20 y.o. G1P0 reporting increased urination, concern for UTI. Also having contractions.  +FM, denies LOF, VB, contractions, vaginal discharge.  PE: BP 122/79 mmHg  Pulse 110  Temp(Src) 97.9 F (36.6 C)  Resp 20  LMP 04/06/2015 (Approximate) Gen: calm comfortable, NAD Resp: normal effort, no distress Abd: gravid  ROS, labs, PMH reviewed NST reactive   Plan: - fetal kick counts reinforced, term labor precautions - continue routine follow up in OB clinic  Caren Macadam, MD 5:11 PM

## 2015-12-22 NOTE — H&P (Signed)
OBSTETRIC ADMISSION HISTORY AND PHYSICAL  Emily Villanueva is a 20 y.o. female G1P0 with IUP at [redacted]w[redacted]d by LMP presenting for spontaneous onset of labor after spontaneous rupture of membranes around 1700 today. She reports +FMs, no VB, no blurry vision, headaches or peripheral edema, and RUQ pain.  She plans on breastfeeding. She request Depo for birth control.  Dating: By LMP--->  Estimated Date of Delivery: 01/11/16  Prenatal History/Complications: 3rd Trimester Depression  Past Medical History: Past Medical History  Diagnosis Date  . Scoliosis   . Scoliosis     Past Surgical History: Past Surgical History  Procedure Laterality Date  . Tonsillectomy      Obstetrical History: OB History    Gravida Para Term Preterm AB TAB SAB Ectopic Multiple Living   1         0      Social History: Social History   Social History  . Marital Status: Married    Spouse Name: N/A  . Number of Children: N/A  . Years of Education: N/A   Social History Main Topics  . Smoking status: Former Smoker -- 1.50 packs/day for 3 years  . Smokeless tobacco: None  . Alcohol Use: No  . Drug Use: No  . Sexual Activity: Yes   Other Topics Concern  . None   Social History Narrative    Family History: Family History  Problem Relation Age of Onset  . Stroke Mother     Allergies: Allergies  Allergen Reactions  . Gluten Meal Hives  . Lactose Intolerance (Gi) Hives  . Peanuts [Peanut Oil] Hives  . Shrimp [Shellfish Allergy] Hives  . Wheat Bran Hives    Prescriptions prior to admission  Medication Sig Dispense Refill Last Dose  . acetaminophen (TYLENOL) 500 MG tablet Take 500 mg by mouth every 6 (six) hours as needed for moderate pain.    prn  . loratadine-pseudoephedrine (CLARITIN-D 24-HOUR) 10-240 MG 24 hr tablet Take 1 tablet by mouth daily.   12/22/2015 at Unknown time  . Prenat Vit-Fe Gly Cys-FA-Omega (ENBRACE HR) CAPS Take 1 capsule by mouth daily.   5 12/22/2015 at Unknown time  .  sertraline (ZOLOFT) 50 MG tablet Take 1 tablet (50 mg total) by mouth daily. 30 tablet 2 12/22/2015 at Unknown time     Review of Systems   All systems reviewed and negative except as stated in HPI  Blood pressure 129/73, pulse 130, temperature 97.9 F (36.6 C), last menstrual period 04/06/2015, SpO2 98 %. General appearance: alert, cooperative and no distress Lungs: clear to auscultation bilaterally Heart: regular rate and rhythm Abdomen: soft, non-tender; bowel sounds normal Extremities: Homans sign is negative, no BLE edema Presentation: cephalic Fetal monitoringBaseline: 150 bpm, Variability: Good {> 6 bpm), Accelerations: Reactive and Decelerations: Absent Uterine activityFrequency: Every 2-3 minutes Dilation: 3.5 Effacement (%): 90 Station: -2 Exam by:: Lavonna Rua, RNC   Prenatal labs: ABO, Rh: A/POS/-- (11/02 1739) Antibody: NEG (11/02 1739) Rubella: Immune RPR: NON REAC (01/16 1007)  HBsAg: NEGATIVE (11/02 1739)  HIV: NONREACTIVE (01/16 1007)  GBS:   unknown 1 hr Glucola 108 Genetic screening  Quad: low risk Anatomy US Normal  Prenatal Transfer Tool  Maternal Diabetes: No Genetic Screening: Normal Maternal Ultrasounds/Referrals: Normal Fetal Ultrasounds or other Referrals:  None Maternal Substance Abuse:  No Significant Maternal Medications:  Meds include: Zoloft Significant Maternal Lab Results: None  Results for orders placed or performed during the hospital encounter of 12/22/15 (from the past 24 hour(s))  [employer]  Collection Time: 12/22/15  6:54 PM  Result Value Ref Range   POCT Fern Test Positive = ruptured amniotic membanes   Results for orders placed or performed during the hospital encounter of 12/22/15 (from the past 24 hour(s))  Urinalysis, Routine w reflex microscopic (not at [facility])   Collection Time: 12/22/15 11:45 AM  Result Value Ref Range   Color, Urine YELLOW YELLOW   APPearance CLEAR CLEAR   Specific Gravity, Urine 1.010 1.005 - 1.030    pH 5.5 5.0 - 8.0   Glucose, UA NEGATIVE NEGATIVE mg/dL   Hgb urine dipstick TRACE (A) NEGATIVE   Bilirubin Urine NEGATIVE NEGATIVE   Ketones, ur NEGATIVE NEGATIVE mg/dL   Protein, ur NEGATIVE NEGATIVE mg/dL   Nitrite NEGATIVE NEGATIVE   Leukocytes, UA SMALL (A) NEGATIVE  Urine microscopic-add on   Collection Time: 12/22/15 11:45 AM  Result Value Ref Range   Squamous Epithelial / LPF 6-30 (A) NONE SEEN   WBC, UA 0-5 0 - 5 WBC/hpf   RBC / HPF 0-5 0 - 5 RBC/hpf   Bacteria, UA FEW (A) NONE SEEN   Urine-Other YEAST     Patient Active Problem List   Diagnosis Date Noted  . Depression complicating pregnancy, antepartum 10/19/2015  . Obesity in pregnancy, antepartum 09/23/2015  . Family history of movement disorder 09/21/2015  . Supervision of normal first pregnancy, antepartum 08/05/2015    Assessment: [name] is a 20 y.o. G1P0 at [redacted]w[redacted]d here for SOL after ROM.   #Labor: Progressing normally, expectant management. Will consider Pitocin if needed #Pain: No pain meds at this time, can get epidural if she desires #FWB: Cat 1 #ID: GBS status unknown, will  #MOF: Breast #MOC: Depo #Circ:  n/a #Depression: Continue Zoloft 50 mg daily  Carlyle Dolly 12/22/2015, 7:11 PM   OB fellow attestation:  I have seen and examined this patient; I agree with above documentation in the resident's note.   [name] is a 20 y.o. G1P0 here for SROM  PE: BP 111/54 mmHg  Pulse 185  Temp(Src) 98.2 F (36.8 C) (Oral)  Resp 16  Ht 5\' 10"  (1.778 m)  Wt 196 lb (88.905 kg)  BMI 28.12 kg/m2  SpO2 100%  LMP 04/06/2015 (Approximate) Gen: calm comfortable, NAD Resp: normal effort, no distress Abd: gravid  ROS, labs, PMH reviewed  Plan: Admit for expectant management vs augmentation GBS unk, PCR ordered and will start pcn if appropriate Plans on breastfeeding and Depo for contraception Will need pp follow up for mood  Caren Macadam, MD  Family Medicine, OB  Fellow 12/23/2015, 8:19 AM

## 2015-12-22 NOTE — Progress Notes (Signed)
Emily Villanueva is a 20 y.o. G1P0 at [redacted]w[redacted]d.  Subjective: Very uncomfortable. Requesting epidural.   Objective: BP 129/73 mmHg  Pulse 116  Temp(Src) 97.9 F (36.6 C)  SpO2 96%  LMP 04/06/2015 (Approximate)   FHT:  FHR: 150 bpm, variability: mod,  accelerations:  15x15,  decelerations:  none UC:   Q 3 minutes, strong  Dilation: 3.5 Effacement (%): 90 Cervical Position: Middle Station: -2 Presentation: Vertex Exam by:: Lavonna Rua, RNC  Labs: Results for orders placed or performed during the hospital encounter of 12/22/15 (from the past 24 hour(s))  OB RESULT CONSOLE Group B Strep     Status: None   Collection Time: 12/22/15 12:00 AM  Result Value Ref Range   GBS Negative   Fern Test     Status: None   Collection Time: 12/22/15  6:54 PM  Result Value Ref Range   POCT Fern Test Positive = ruptured amniotic membanes   Group B strep by PCR     Status: None   Collection Time: 12/22/15  7:14 PM  Result Value Ref Range   Group B strep by PCR NEGATIVE NEGATIVE  CBC     Status: Abnormal   Collection Time: 12/22/15  8:00 PM  Result Value Ref Range   WBC 12.0 (H) 4.0 - 10.5 K/uL   RBC 4.05 3.87 - 5.11 MIL/uL   Hemoglobin 11.5 (L) 12.0 - 15.0 g/dL   HCT 35.0 (L) 36.0 - 46.0 %   MCV 86.4 78.0 - 100.0 fL   MCH 28.4 26.0 - 34.0 pg   MCHC 32.9 30.0 - 36.0 g/dL   RDW 15.0 11.5 - 15.5 %   Platelets 231 150 - 400 K/uL    Assessment / Plan: [redacted]w[redacted]d week IUP Labor: Early Fetal Wellbeing:  Category I Pain Control:  Getting epidural Anticipated MOD:  SVD  [place], CNM 12/22/2015 8:58 PM

## 2015-12-22 NOTE — Progress Notes (Signed)
Dr Juanito Doom in to see pt

## 2015-12-22 NOTE — Progress Notes (Signed)
Up to BR after sve

## 2015-12-22 NOTE — Progress Notes (Signed)
Baby active but pt does not feel FM

## 2015-12-22 NOTE — Discharge Instructions (Signed)
Come to the MAU (maternity admission unit) for 1) Strong contractions every 2-3 minutes for at least 1 hour that do no go away when you drink water or take a warm shower. These contractions will be so strong all you can do is breath through them 2) Vaginal bleeding- anything more than spotting 3) Loss of fluid like you broke your water 4) Decreased movement of your baby  [place] Contractions Contractions of the uterus can occur throughout pregnancy. Contractions are not always a sign that you are in labor.  WHAT ARE BRAXTON HICKS CONTRACTIONS?  Contractions that occur before labor are called Braxton Hicks contractions, or false labor. Toward the end of pregnancy (32-34 weeks), these contractions can develop more often and may become more forceful. This is not true labor because these contractions do not result in opening (dilatation) and thinning of the cervix. They are sometimes difficult to tell apart from true labor because these contractions can be forceful and people have different pain tolerances. You should not feel embarrassed if you go to the hospital with false labor. Sometimes, the only way to tell if you are in true labor is for your health care provider to look for changes in the cervix. If there are no prenatal problems or other health problems associated with the pregnancy, it is completely safe to be sent home with false labor and await the onset of true labor. HOW CAN YOU TELL THE DIFFERENCE BETWEEN TRUE AND FALSE LABOR? False Labor  The contractions of false labor are usually shorter and not as hard as those of true labor.   The contractions are usually irregular.   The contractions are often felt in the front of the lower abdomen and in the groin.   The contractions may go away when you walk around or change positions while lying down.   The contractions get weaker and are shorter lasting as time goes on.   The contractions do not usually become progressively  stronger, regular, and closer together as with true labor.  True Labor  Contractions in true labor last 30-70 seconds, become very regular, usually become more intense, and increase in frequency.   The contractions do not go away with walking.   The discomfort is usually felt in the top of the uterus and spreads to the lower abdomen and low back.   True labor can be determined by your health care provider with an exam. This will show that the cervix is dilating and getting thinner.  WHAT TO REMEMBER  Keep up with your usual exercises and follow other instructions given by your health care provider.   Take medicines as directed by your health care provider.   Keep your regular prenatal appointments.   Eat and drink lightly if you think you are going into labor.   If Braxton Hicks contractions are making you uncomfortable:   Change your position from lying down or resting to walking, or from walking to resting.   Sit and rest in a tub of warm water.   Drink 2-3 glasses of water. Dehydration may cause these contractions.   Do slow and deep breathing several times an hour.  WHEN SHOULD I SEEK IMMEDIATE MEDICAL CARE? Seek immediate medical care if:  Your contractions become stronger, more regular, and closer together.   You have fluid leaking or gushing from your vagina.   You have a fever.   You pass blood-tinged mucus.   You have vaginal bleeding.   You have continuous abdominal  pain.   You have low back pain that you never had before.   You feel your baby's head pushing down and causing pelvic pressure.   Your baby is not moving as much as it used to.    This information is not intended to replace advice given to you by your health care provider. Make sure you discuss any questions you have with your health care provider.   Document Released: 09/19/2005 Document Revised: 09/24/2013 Document Reviewed: 07/01/2013 Elsevier Interactive Patient  Education [employer].

## 2015-12-22 NOTE — Progress Notes (Signed)
Dr Juanito Doom in to see pt earlier to discuss d/c plan. Written and verbal d/c instructions given and understanding voiced.

## 2015-12-22 NOTE — MAU Note (Signed)
States around 1717 she felt a gush of fluid and some leaking. States she has felt small amount since then. Having contractions.

## 2015-12-22 NOTE — Progress Notes (Signed)
Dr Juanito Doom notified of pt's admission and status. Aware of no FM since last pm but baby currently active but pt unaware. U/A in process. Will see pt

## 2015-12-22 NOTE — Anesthesia Procedure Notes (Signed)
Epidural Patient location during procedure: OB  Staffing Anesthesiologist: Franne Grip Performed by: anesthesiologist   Preanesthetic Checklist Completed: patient identified, site marked, surgical consent, pre-op evaluation, timeout performed, IV checked, risks and benefits discussed and monitors and equipment checked  Epidural Patient position: sitting Prep: DuraPrep Patient monitoring: heart rate and blood pressure Approach: midline Location: L3-L4 Injection technique: LOR air  Needle:  Needle type: Tuohy  Needle gauge: 17 G Needle insertion depth: 7 cm Catheter type: closed end flexible Catheter size: 19 Gauge Catheter at skin depth: 11 cm Test dose: negative and Other  Assessment Events: blood not aspirated, injection not painful, no injection resistance, negative IV test and no paresthesia  Additional Notes Reason for block:procedure for pain

## 2015-12-22 NOTE — Anesthesia Preprocedure Evaluation (Addendum)
Anesthesia Evaluation  Patient identified by MRN, date of birth, ID band Patient awake    Reviewed: Allergy & Precautions, NPO status , Patient's Chart, lab work & pertinent test results  Airway Mallampati: II  TM Distance: >3 FB Neck ROM: Full    Dental no notable dental hx.    Pulmonary former smoker,    Pulmonary exam normal breath sounds clear to auscultation       Cardiovascular negative cardio ROS Normal cardiovascular exam Rhythm:Regular Rate:Normal     Neuro/Psych PSYCHIATRIC DISORDERS Depression negative neurological ROS     GI/Hepatic negative GI ROS, Neg liver ROS,   Endo/Other  negative endocrine ROS  Renal/GU negative Renal ROS  negative genitourinary   Musculoskeletal negative musculoskeletal ROS (+)   Abdominal   Peds negative pediatric ROS (+)  Hematology negative hematology ROS (+)   Anesthesia Other Findings Scoliosis per patient. (mild)  Reproductive/Obstetrics negative OB ROS                             Anesthesia Physical Anesthesia Plan  ASA: II  Anesthesia Plan: Epidural   Post-op Pain Management:    Induction: Intravenous  Airway Management Planned: Natural Airway  Additional Equipment:   Intra-op Plan:   Post-operative Plan:   Informed Consent: I have reviewed the patients History and Physical, chart, labs and discussed the procedure including the risks, benefits and alternatives for the proposed anesthesia with the patient or authorized representative who has indicated his/her understanding and acceptance.   Dental advisory given  Plan Discussed with: CRNA  Anesthesia Plan Comments: (Informed consent obtained prior to proceeding including risk of failure, 1% risk of PDPH, risk of minor discomfort and bruising.  Discussed rare but serious complications including epidural abscess, permanent nerve injury, epidural hematoma.  Discussed alternatives to  epidural analgesia and patient desires to proceed.  Timeout performed pre-procedure verifying patient name, procedure, and platelet count.  Patient tolerated procedure well. )       Anesthesia Quick Evaluation

## 2015-12-23 ENCOUNTER — Encounter (HOSPITAL_COMMUNITY): Payer: Self-pay | Admitting: Obstetrics

## 2015-12-23 DIAGNOSIS — Z3A37 37 weeks gestation of pregnancy: Secondary | ICD-10-CM

## 2015-12-23 DIAGNOSIS — O4292 Full-term premature rupture of membranes, unspecified as to length of time between rupture and onset of labor: Secondary | ICD-10-CM

## 2015-12-23 LAB — ABO/RH: ABO/RH(D): A POS

## 2015-12-23 LAB — RPR: RPR: NONREACTIVE

## 2015-12-23 MED ORDER — TERBUTALINE SULFATE 1 MG/ML IJ SOLN
0.2500 mg | Freq: Once | INTRAMUSCULAR | Status: AC
  Administered 2015-12-23: 0.25 mg via SUBCUTANEOUS

## 2015-12-23 MED ORDER — ONDANSETRON HCL 4 MG/2ML IJ SOLN: 4.0000 mg | INTRAMUSCULAR | Status: DC | PRN

## 2015-12-23 MED ORDER — WITCH HAZEL-GLYCERIN EX PADS
1.0000 | MEDICATED_PAD | CUTANEOUS | Status: DC | PRN
Start: 2015-12-23 — End: 2015-12-25

## 2015-12-23 MED ORDER — SENNOSIDES-DOCUSATE SODIUM 8.6-50 MG PO TABS
2.0000 | ORAL_TABLET | ORAL | Status: DC
  Administered 2015-12-24 – 2015-12-25 (×2): 2 via ORAL
  Filled 2015-12-23 (×2): qty 2

## 2015-12-23 MED ORDER — BENZOCAINE-MENTHOL 20-0.5 % EX AERO
1.0000 | INHALATION_SPRAY | CUTANEOUS | Status: DC | PRN
Start: 2015-12-23 — End: 2015-12-25
  Administered 2015-12-25: 1 via TOPICAL
  Filled 2015-12-23 (×2): qty 56

## 2015-12-23 MED ORDER — ACETAMINOPHEN 325 MG PO TABS
650.0000 mg | ORAL_TABLET | ORAL | Status: DC | PRN
  Administered 2015-12-23 – 2015-12-25 (×2): 650 mg via ORAL
  Filled 2015-12-23 (×2): qty 2

## 2015-12-23 MED ORDER — LIDOCAINE-EPINEPHRINE (PF) 2 %-1:200000 IJ SOLN
INTRAMUSCULAR | Status: DC | PRN
  Administered 2015-12-23: 5 mL via EPIDURAL
  Administered 2015-12-23: 4 mL via EPIDURAL
  Administered 2015-12-23: 5 mL via EPIDURAL
  Administered 2015-12-23: 4 mL via EPIDURAL

## 2015-12-23 MED ORDER — DIBUCAINE 1 % RE OINT: 1.0000 "application " | TOPICAL_OINTMENT | RECTAL | Status: DC | PRN

## 2015-12-23 MED ORDER — SIMETHICONE 80 MG PO CHEW: 80.0000 mg | CHEWABLE_TABLET | ORAL | Status: DC | PRN

## 2015-12-23 MED ORDER — NALBUPHINE HCL 10 MG/ML IJ SOLN
10.0000 mg | INTRAMUSCULAR | Status: DC | PRN
  Administered 2015-12-23: 10 mg via INTRAVENOUS

## 2015-12-23 MED ORDER — IBUPROFEN 600 MG PO TABS
600.0000 mg | ORAL_TABLET | Freq: Four times a day (QID) | ORAL | Status: DC
  Administered 2015-12-23 – 2015-12-25 (×7): 600 mg via ORAL
  Filled 2015-12-23 (×7): qty 1

## 2015-12-23 MED ORDER — PRENATAL MULTIVITAMIN CH
1.0000 | ORAL_TABLET | Freq: Every day | ORAL | Status: DC
  Filled 2015-12-23: qty 1

## 2015-12-23 MED ORDER — IBUPROFEN 600 MG PO TABS: 600.0000 mg | ORAL_TABLET | Freq: Four times a day (QID) | ORAL | Status: DC

## 2015-12-23 MED ORDER — NALBUPHINE HCL 10 MG/ML IJ SOLN
INTRAMUSCULAR | Status: AC
  Filled 2015-12-23: qty 1

## 2015-12-23 MED ORDER — LANOLIN HYDROUS EX OINT: TOPICAL_OINTMENT | CUTANEOUS | Status: DC | PRN

## 2015-12-23 MED ORDER — DIPHENHYDRAMINE HCL 25 MG PO CAPS: 25.0000 mg | ORAL_CAPSULE | Freq: Four times a day (QID) | ORAL | Status: DC | PRN

## 2015-12-23 MED ORDER — OXYTOCIN 10 UNIT/ML IJ SOLN
INTRAMUSCULAR | Status: AC
  Administered 2015-12-23: 10 [IU] via INTRAMUSCULAR
  Filled 2015-12-23: qty 1

## 2015-12-23 MED ORDER — ONDANSETRON HCL 4 MG PO TABS: 4.0000 mg | ORAL_TABLET | ORAL | Status: DC | PRN

## 2015-12-23 MED ORDER — OXYTOCIN 10 UNIT/ML IJ SOLN
10.0000 [IU] | Freq: Once | INTRAMUSCULAR | Status: AC
  Administered 2015-12-23: 10 [IU] via INTRAMUSCULAR

## 2015-12-23 MED ORDER — TERBUTALINE SULFATE 1 MG/ML IJ SOLN
INTRAMUSCULAR | Status: AC
  Filled 2015-12-23: qty 1

## 2015-12-23 MED ORDER — ZOLPIDEM TARTRATE 5 MG PO TABS: 5.0000 mg | ORAL_TABLET | Freq: Every evening | ORAL | Status: DC | PRN

## 2015-12-23 MED ORDER — TETANUS-DIPHTH-ACELL PERTUSSIS 5-2.5-18.5 LF-MCG/0.5 IM SUSP: 0.5000 mL | Freq: Once | INTRAMUSCULAR | Status: DC

## 2015-12-23 NOTE — Progress Notes (Signed)
Called for increased pain. She had been comfortable until about 0340. At 0350 she complains of intense pain in left groin.  Both legs are quite heavy. Epidural site intact.  Dosed with lidocaine 2% (8cc). RN in room. Placed on left side. Epidural infusion rate increased to 16 cc/hour.  Earlier in the evening she suffered from intense itching and received 3 doses of benadryl (last dose at 2344) and then Nubain when the benadryl was not very effective. Itching is not currently a problem, but I hesitate to put more narcotics in epidural at this time.

## 2015-12-23 NOTE — Lactation Note (Signed)
This note was copied from a baby's chart. Lactation Consultation Note  Patient Name: Emily Villanueva [id number] Date: 12/23/2015 Reason for consult: Initial assessment Baby at 43 hr of life and mom reports bf is going well. Denies breast or nipple pain. Answered questions about smoking while bf. FOB present and stated that mom gets violent when she is not able to smoke. Discussed pacifier use, baby behavior, feeding frequency, pumping, voids, wt loss, breast changes, and nipple care. Mom stated that she can manually express, has seen colostrum, and has a spoon in the room. Given lactation handouts. Aware of OP services and support group.    Maternal Data Has patient been taught Hand Expression?: Yes Does the patient have breastfeeding experience prior to this delivery?: No  Feeding Feeding Type: Breast Fed Length of feed: 25 min  LATCH Score/Interventions                      Lactation Tools Discussed/Used WIC Program: No   Consult Status Consult Status: Follow-up Date: 12/24/15 Follow-up type: In-patient    Denzil Hughes 12/23/2015, 7:22 PM

## 2015-12-23 NOTE — Progress Notes (Addendum)
Emily Villanueva is a 20 y.o. G1P0 at [redacted]w[redacted]d.  Subjective: Very uncomfortable from itching that is uncontrollable. She has had several rounds of benadryl and sarna lotion without relief.  No pain.   Objective: BP 131/76 mmHg  Pulse 96  Temp(Src) 98.6 F (37 C) (Oral)  Resp 98  Ht 5\' 10"  (1.778 m)  Wt 196 lb (88.905 kg)  BMI 28.12 kg/m2  SpO2 96%  LMP 04/06/2015 (Approximate)   FHT:  FHR: 145 bpm, variability: mod,  accelerations:  15x15,  decelerations:  none UC:   Q 2-4 minutes Cervical Exam: 7/90/0  Labs: Results for orders placed or performed during the hospital encounter of 12/22/15 (from the past 24 hour(s))  Fern Test     Status: None   Collection Time: 12/22/15  6:54 PM  Result Value Ref Range   POCT Fern Test Positive = ruptured amniotic membanes   Group B strep by PCR     Status: None   Collection Time: 12/22/15  7:14 PM  Result Value Ref Range   Group B strep by PCR NEGATIVE NEGATIVE  CBC     Status: Abnormal   Collection Time: 12/22/15  8:00 PM  Result Value Ref Range   WBC 12.0 (H) 4.0 - 10.5 K/uL   RBC 4.05 3.87 - 5.11 MIL/uL   Hemoglobin 11.5 (L) 12.0 - 15.0 g/dL   HCT 35.0 (L) 36.0 - 46.0 %   MCV 86.4 78.0 - 100.0 fL   MCH 28.4 26.0 - 34.0 pg   MCHC 32.9 30.0 - 36.0 g/dL   RDW 15.0 11.5 - 15.5 %   Platelets 231 150 - 400 K/uL  Type and screen [place]     Status: None   Collection Time: 12/22/15  8:00 PM  Result Value Ref Range   ABO/RH(D) A POS    Antibody Screen NEG    Sample Expiration 12/25/2015     Assessment / Plan: [redacted]w[redacted]d week IUP. Active labor Labor: Progressing normally. Hold off on augmentation.  Fetal Wellbeing:  Category I Pain Control:  Epidural; however has severe pruritis from medication. Anesthesia notified by nurse and will give nubain. Anticipated MOD:  SVD   Katheren Shams, DO 12/23/2015 12:19 AM

## 2015-12-23 NOTE — Progress Notes (Signed)
Patient ID: Emily Villanueva, female   DOB: 1996/05/13, 20 y.o.   MRN: [id number] Called to BS at 2:13 for pt severe abd pain and inadequate resting tone by RN palpation. Upon arrival to room pt was in severe distress, entire body shaking, pt extremely tense. Anesthesia at [facility] dosing epidural. FHR reassuring. VSS. No VB. No cervical change. Terb given.   Pt's BP dropped after epidural dosing. NHR initially rose to 160=170's, then possibly dropped to 40's. Ephedrine given per anesthesia. PusleOx applied to pt's toe. 100%. Pulse 170's. IUPC applied, Uterus relaxed.. Pt still shaking, but dozing, comfortable. Dr. Roselie Awkward notified of pt Sx, interventions, changes in VS and response to interventions. Pt now stable. No new orders.  [place], [place] 12/23/2015 3:58 AM

## 2015-12-23 NOTE — Anesthesia Postprocedure Evaluation (Signed)
Anesthesia Post Note  Patient: Emily Villanueva  Procedure(s) Performed: * No procedures listed *  Patient location during evaluation: Mother Baby Anesthesia Type: Epidural Level of consciousness: awake and alert Pain management: pain level controlled Vital Signs Assessment: post-procedure vital signs reviewed and stable Respiratory status: spontaneous breathing Cardiovascular status: stable Postop Assessment: no headache, no backache, patient able to bend at knees, epidural receding, no signs of nausea or vomiting and adequate PO intake Anesthetic complications: no    Last Vitals:  Filed Vitals:   12/23/15 0845 12/23/15 0900  BP: 132/57 133/65  Pulse: 115 108  Temp: 36.9 C   Resp: 18     Last Pain:  Filed Vitals:   12/23/15 1453  PainSc: 4                  Nadean Montanaro, [employer]

## 2015-12-24 ENCOUNTER — Encounter: Payer: Medicaid Other | Admitting: Obstetrics & Gynecology

## 2015-12-24 LAB — CBC
HEMATOCRIT: 30.9 % — AB (ref 36.0–46.0)
Hemoglobin: 10 g/dL — ABNORMAL LOW (ref 12.0–15.0)
MCH: 28.5 pg (ref 26.0–34.0)
MCHC: 32.4 g/dL (ref 30.0–36.0)
MCV: 88 fL (ref 78.0–100.0)
PLATELETS: 219 10*3/uL (ref 150–400)
RBC: 3.51 MIL/uL — AB (ref 3.87–5.11)
RDW: 15.4 % (ref 11.5–15.5)
WBC: 10.1 10*3/uL (ref 4.0–10.5)

## 2015-12-24 NOTE — Progress Notes (Signed)
CM / UR chart review completed.  

## 2015-12-24 NOTE — Progress Notes (Signed)
POSTPARTUM PROGRESS NOTE  Post Partum Day 1 Subjective:  Emily Villanueva is a 20 y.o. G1P1001 [redacted]w[redacted]d s/p NSVD.  No acute events overnight.  Pt denies problems with ambulating, voiding or po intake.  She denies nausea or vomiting.  Pain is well controlled, though patient experienced a severe abdominal cramp last night that worried her.  Lochia Minimal.   Objective: Blood pressure 128/70, pulse 91, temperature 98.1 F (36.7 C), temperature source Oral, resp. rate 16, height 5\' 10"  (1.778 m), weight 88.905 kg (196 lb), last menstrual period 04/06/2015, SpO2 100 %, unknown if currently breastfeeding.  Physical Exam:  General: alert, cooperative and no distress Lochia: normal flow Uterine Fundus: firm, 1 cm below umbilicus DVT Evaluation: No calf tenderness Extremities: 1+ edema in LE up to patella   Recent Labs  12/22/15 2000 12/24/15 0621  HGB 11.5* 10.0*  HCT 35.0* 30.9*    Assessment/Plan:  ASSESSMENT: Emily Villanueva is a 20 y.o. G1P1001 [redacted]w[redacted]d s/p NSVD.  Plan for discharge tomorrow   LOS: 2 days   Pauline Aus 12/24/2015, 7:19 AM    CNM attestation Post Partum Day #1  Emily Villanueva is a 20 y.o. G1P1001 s/p SVD.  Pt denies problems with ambulating, voiding or po intake. Pain is well controlled.  Plan for birth control is Depo-Provera.  Method of Feeding: breast  PE:  BP 128/70 mmHg  Pulse 91  Temp(Src) 98.1 F (36.7 C) (Oral)  Resp 16  Ht 5\' 10"  (1.778 m)  Wt 88.905 kg (196 lb)  BMI 28.12 kg/m2  SpO2 100%  LMP 04/06/2015 (Approximate)  Breastfeeding? Unknown Fundus firm  Plan for discharge: 12/25/15 SW consult reordered due to depression; lapse in [place], [place], CNM 8:59 AM  12/24/2015
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
## 2015-12-25 NOTE — Lactation Note (Signed)
This note was copied from a baby's chart. Lactation Consultation Note  Mother states her nipples are tender but has comfort gels and discussed applying ebm. Reviewed how to [facility] baby, applying ebm and gave her comfort gels. Provided mother with hand pump.  Discussed lanolin use. Reminded her to hand express, prepump and get good flow before latching then compress breast during feeding. Reviewed engorgement care and monitoring voids/stools. Asked mother about setting up a support system while she breastfeeds. Mom encouraged to feed baby 8-12 times/24 hours and with feeding cues.    Patient Name: Girl Katrielle Monterosa [id number] Date: 12/25/2015 Reason for consult: Follow-up assessment   Maternal Data    Feeding    LATCH Score/Interventions                      Lactation Tools Discussed/Used     Consult Status Consult Status: Complete    Carlye Grippe 12/25/2015, 9:45 AM

## 2015-12-25 NOTE — Discharge Summary (Signed)
OB Discharge Summary     Patient Name: Emily Villanueva DOB: September 11, 1996 MRN: [id number]  Date of admission: 12/22/2015 Delivering MD: Manya Silvas   Date of discharge: 12/25/2015  Admitting diagnosis: 37wks ctx water broke Intrauterine pregnancy: [redacted]w[redacted]d     Secondary diagnosis:  Active Problems:   Active labor  Additional problems: Depression, possible SVT     Discharge diagnosis: Term Pregnancy Delivered                                                                                                Post partum procedures:None  Augmentation: None  Complications: None  Hospital course:  Onset of Labor With Vaginal Delivery     20 y.o. yo G1P1001 at [redacted]w[redacted]d was admitted in Latent Labor on 12/22/2015. Patient had an uncomplicated labor course except for hoTN s/p epidural otherwise as follows:  Membrane Rupture Time/Date: 1:06 AM ,12/23/2015   Intrapartum Procedures: Episiotomy: None [1]                                         Lacerations:  Labial [10]  Patient had a delivery of a Viable infant. 12/23/2015  Information for the patient's newborn:  [place], [place] [id number]       Pateint had an uncomplicated postpartum course.  Social work consulted d/t hx of depression.  She is ambulating, tolerating a regular diet, passing flatus, and urinating well. Patient is discharged home in stable condition on 12/25/2015.    Physical exam  Filed Vitals:   12/23/15 1600 12/24/15 0000 12/24/15 0600 12/24/15 1824  BP: 115/66 128/56 128/70 136/62  Pulse: 89 100 91 69  Temp: 98.2 F (36.8 C) 98.6 F (37 C) 98.1 F (36.7 C) 97.8 F (36.6 C)  TempSrc: Oral Oral Oral Oral  Resp: 18 18 16 17   Height:      Weight:      SpO2:       General: alert and cooperative Lochia: appropriate Uterine Fundus: firm Incision: NA DVT Evaluation: No evidence of DVT seen on physical exam. Labs: Lab Results  Component Value Date   WBC 10.1 12/24/2015   HGB 10.0* 12/24/2015   HCT 30.9* 12/24/2015    MCV 88.0 12/24/2015   PLT 219 12/24/2015   CMP Latest Ref Rng 09/15/2015  Glucose 65 - 99 mg/dL 100(H)  BUN 6 - 20 mg/dL 9  Creatinine 0.44 - 1.00 mg/dL 0.46  Sodium 135 - 145 mmol/L 134(L)  Potassium 3.5 - 5.1 mmol/L 3.7  Chloride 101 - 111 mmol/L 103  CO2 22 - 32 mmol/L 22  Calcium 8.9 - 10.3 mg/dL 8.9  Total Protein 6.5 - 8.1 g/dL 6.2(L)  Total Bilirubin 0.3 - 1.2 mg/dL 0.3  Alkaline Phos 38 - 126 U/L 56  AST 15 - 41 U/L 22  ALT 14 - 54 U/L 13(L)    Discharge instruction: per After Visit Summary and "Baby and Me Booklet".  After visit meds:    Medication List    ASK your doctor  about these medications        acetaminophen 500 MG tablet  Commonly known as:  TYLENOL  Take 500 mg by mouth every 6 (six) hours as needed for moderate pain.     ENBRACE HR Caps  Take 1 capsule by mouth daily.     loratadine-pseudoephedrine 10-240 MG 24 hr tablet  Commonly known as:  CLARITIN-D 24-hour  Take 1 tablet by mouth daily.     sertraline 50 MG tablet  Commonly known as:  ZOLOFT  Take 1 tablet (50 mg total) by mouth daily.        Diet: routine diet  Activity: Advance as tolerated. Pelvic rest for 6 weeks.   Outpatient follow up:6 weeks Follow up Appt:Future Appointments Date Time Provider [place]  02/03/2016 1:15 PM Truett Mainland, DO [facility] None   Follow up Visit:No Follow-up on file.  Postpartum contraception: Depo Provera  Newborn Data: Live born female  Birth Weight: 8 lb 5.7 oz (3790 g) APGAR: 8, 9  Baby Feeding: Breast Disposition:home with mother   12/25/2015 Michael J Bolivia, MD   Patient was seen and examined by me also Vitals stable Labs stable Fundus firm, lochia within normal limits Perineum healing Ext WNL Continue care Ready for discharge  Seabron Spates, CNM

## 2016-01-04 IMAGING — US US OB LIMITED
1 series · 14 of 28 positions shown · non-contrast
Comparison: none

CLINICAL DATA: Dog jumped forcefully onto abdomen 3 days ago,
waxing and waning LEFT periumbilical pain since, former smoker,
smoked during first 3 weeks of pregnancy ; estimated gestational age
of 17 weeks 0 days by LMP

EXAM:
LIMITED OBSTETRIC ULTRASOUND

[Series 1: us ob limited · 0.22mm/px · 37 acquisitions, 14 frames shown]
[im 2/37]
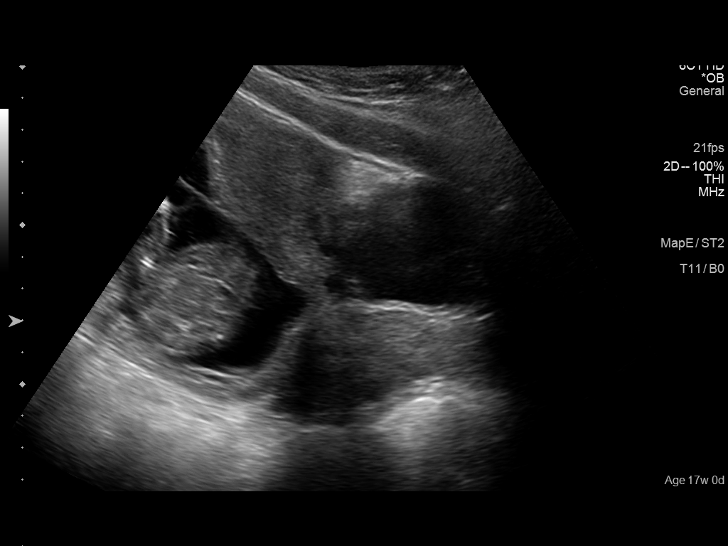
[im 5/37]
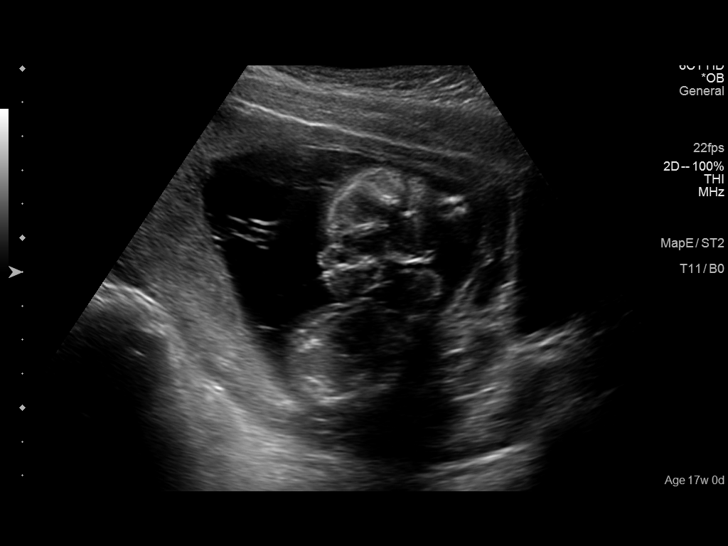
[im 7/37]
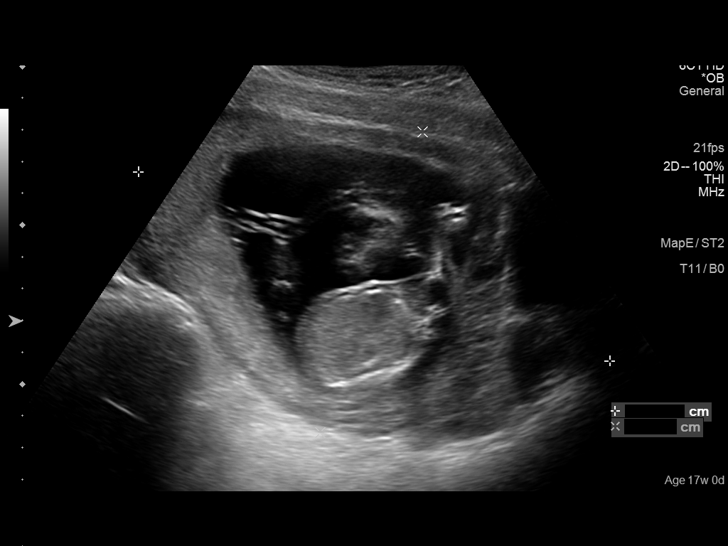
[im 10/37]
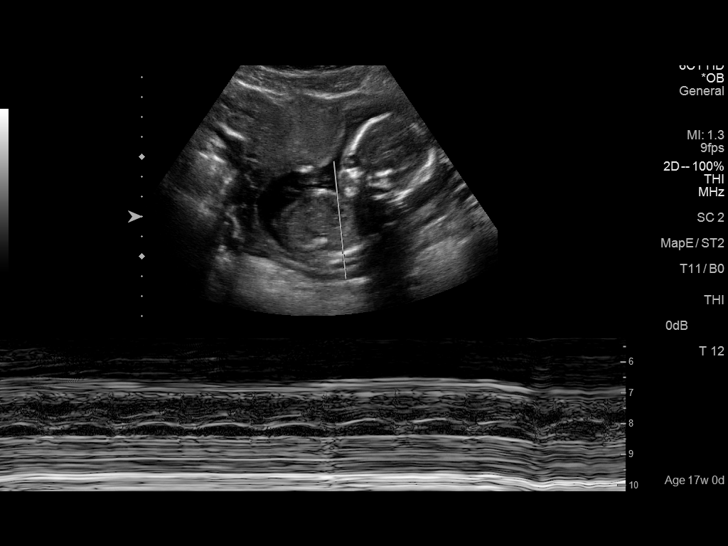
[im 13/37]
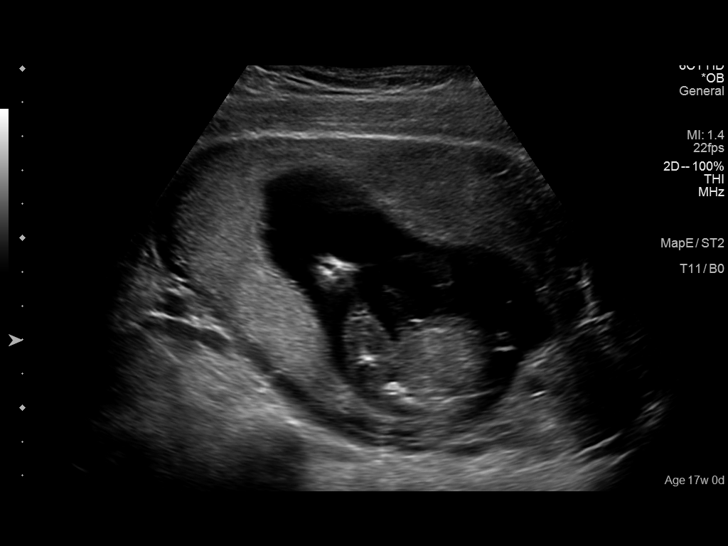
[im 15/37]
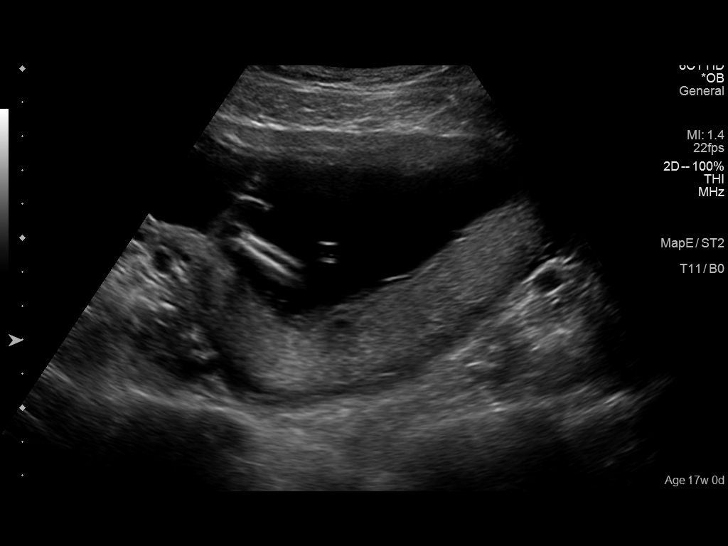
[im 18/37]
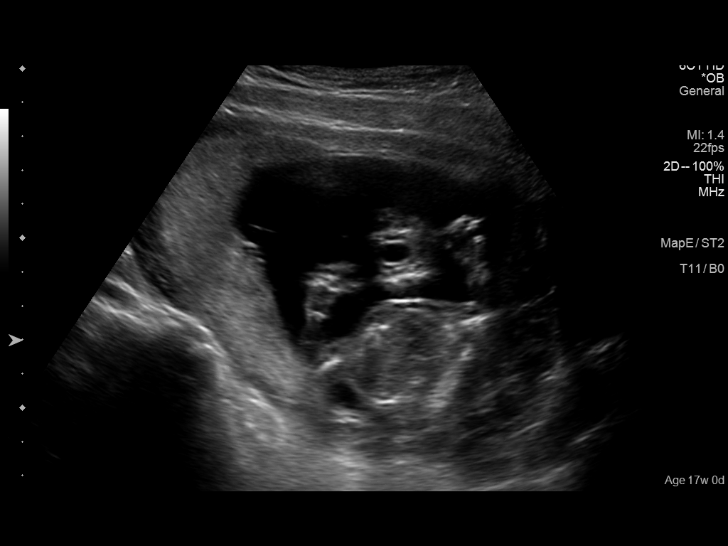
[im 21/37]
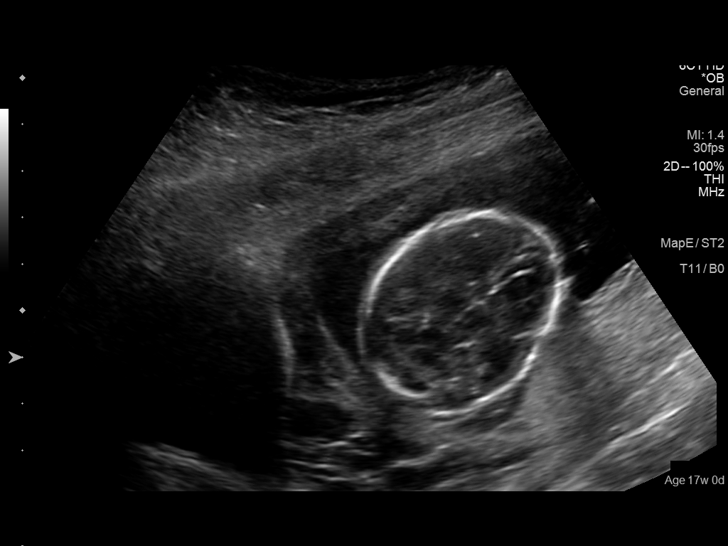
[im 23/37]
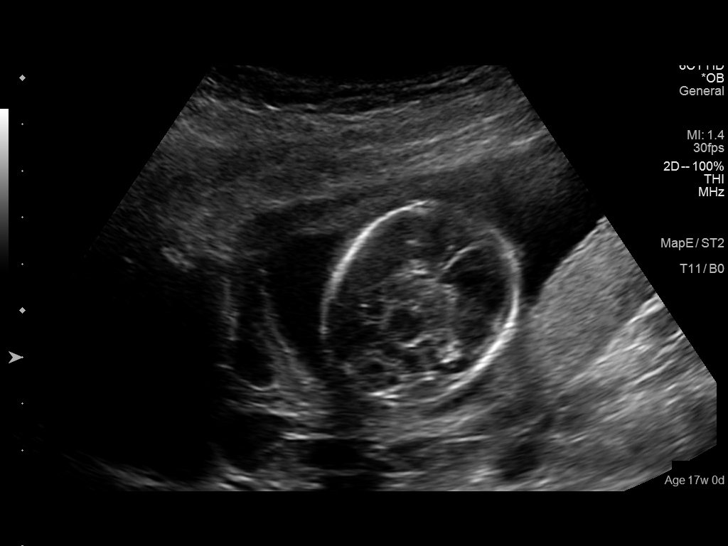
[im 26/37]
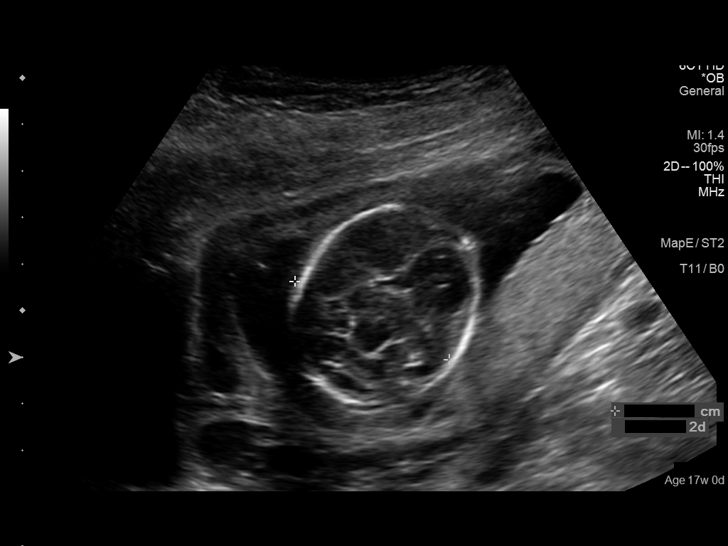
[im 29/37]
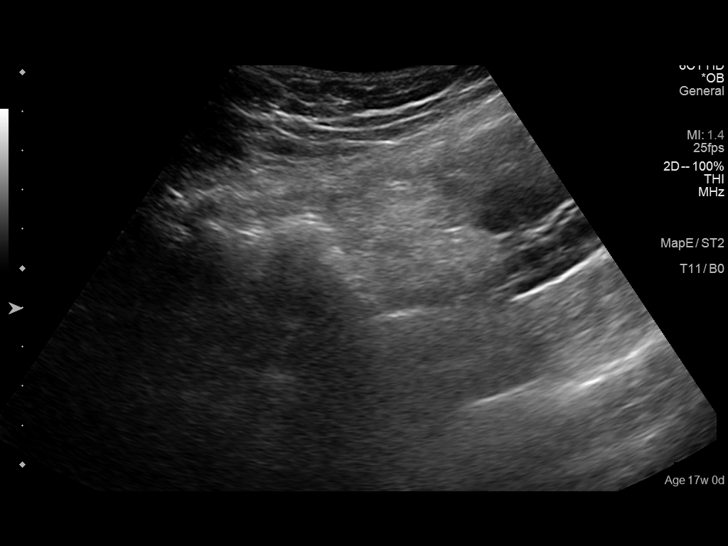
[im 31/37]
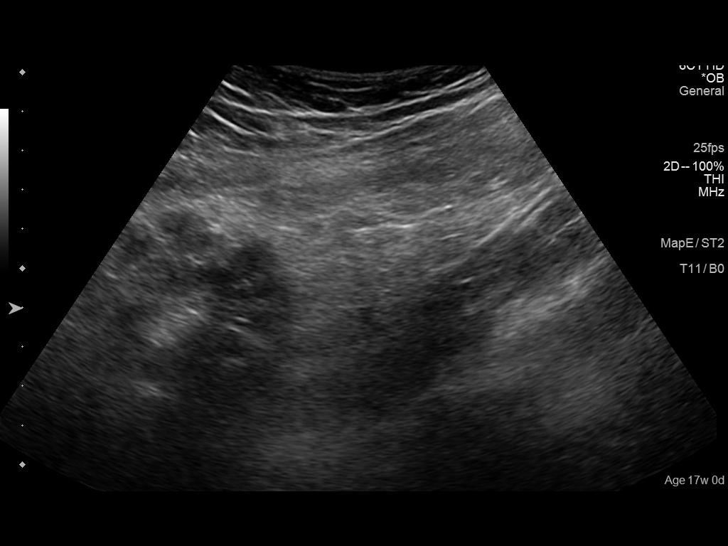
[im 34/37]
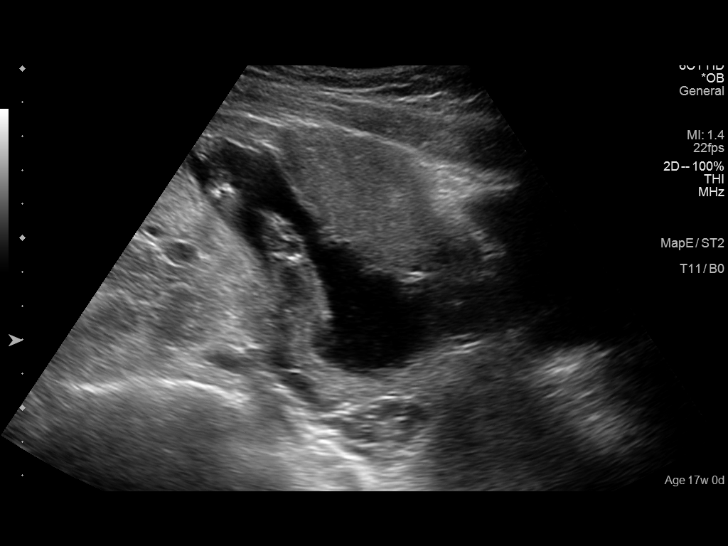
[im 37/37]
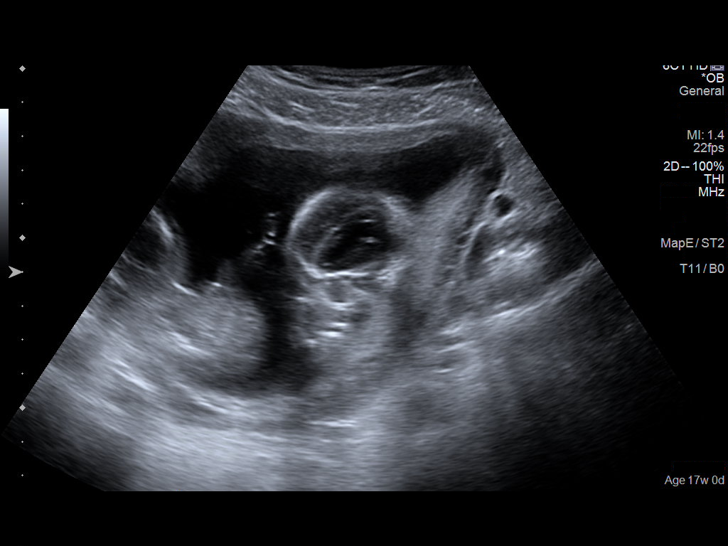

[14 of 28 positions shown; findings below may reference images not displayed]

FINDINGS: Number of Fetuses: 1

Heart Rate:  162 bpm

Movement: Yes

Presentation: Variable

Placental Location: Fundal and RIGHT lateral

Previa: No

Amniotic Fluid (Subjective):  Within normal limits.

BPD:  3.71 cm     17 w 2 d       US EGA:  12/31/2015

MATERNAL FINDINGS:

Cervix:  Appears closed.

Uterus/Adnexae:  No abnormality visualized.
IMPRESSION: Single live intrauterine gestation as above.

No acute abnormalities identified.

This exam is performed on an emergent basis and does not
comprehensively evaluate fetal size, dating, or anatomy; follow-up
complete non emergent OB US recommended as patient indicates this
has not been performed.

## 2016-01-06 ENCOUNTER — Telehealth: Payer: Self-pay

## 2016-01-06 NOTE — Telephone Encounter (Signed)
Patient calls stating that she has had some allergies and has gotten worse and throat has been extremely sore. Patient states she thinks she has a fever. Patient states overall just feeling malaise. Patient instructed to go to primary care/ urgent care for a flu and streph test to rule those out. Patient states understanding. Encourage good hand washing around the newborn since she states other in her house have been sick with viral illness. Kathrene Alu RN BSN

## 2016-01-29 ENCOUNTER — Other Ambulatory Visit: Payer: Self-pay

## 2016-01-29 DIAGNOSIS — F32A Depression, unspecified: Secondary | ICD-10-CM

## 2016-01-29 DIAGNOSIS — O99343 Other mental disorders complicating pregnancy, third trimester: Principal | ICD-10-CM

## 2016-01-29 DIAGNOSIS — F329 Major depressive disorder, single episode, unspecified: Secondary | ICD-10-CM

## 2016-01-29 MED ORDER — SERTRALINE HCL 50 MG PO TABS: 50.0000 mg | ORAL_TABLET | Freq: Every day | ORAL | Status: DC

## 2016-01-29 NOTE — Telephone Encounter (Signed)
Refill request sent by  Pharmacy. Patient has six week postpartum on May 3,2017. One refill sent in to last until her appt. Kathrene Alu RN BSN

## 2016-02-03 ENCOUNTER — Ambulatory Visit: Payer: Medicaid Other | Admitting: Family Medicine

## 2016-03-10 ENCOUNTER — Ambulatory Visit (INDEPENDENT_AMBULATORY_CARE_PROVIDER_SITE_OTHER): Payer: Self-pay | Admitting: Family Medicine

## 2016-03-10 ENCOUNTER — Encounter: Payer: Self-pay | Admitting: Family Medicine

## 2016-03-10 NOTE — Progress Notes (Signed)
  Subjective:     Emily Villanueva is a 20 y.o. female who presents for a postpartum visit. She is 10 weeks postpartum following a spontaneous vaginal delivery. I have fully reviewed the prenatal and intrapartum course. The delivery was at 76 gestational weeks. Outcome: spontaneous vaginal delivery. Anesthesia: epidural. Postpartum course has been normal. Baby's course has been normal. Baby is feeding by breast. Bleeding no bleeding. Bowel function is normal. Bladder function is normal. Patient is sexually active. Contraception method is condoms. Postpartum depression screening: negative.  The following portions of the patient's history were reviewed and updated as appropriate: allergies, current medications, past family history, past medical history, past social history, past surgical history and problem list.  Review of Systems Pertinent items are noted in HPI.   Objective:    BP 118/75 mmHg  Pulse 113  Ht 5\' 10"  (1.778 m)  Wt 179 lb (81.194 kg)  BMI 25.68 kg/m2  LMP 02/17/2016  General:  alert, cooperative and no distress  Lungs: clear to auscultation bilaterally  Heart:  regular rate and rhythm, S1, S2 normal, no murmur, click, rub or gallop  Abdomen: soft, non-tender; bowel sounds normal; no masses,  no organomegaly        Assessment:     Normal postpartum exam. Pap smear not done at today's visit.   Plan:    1. Contraception: condoms and Depo-Provera injections - would like depo shots.  Had unprotected sex last night, will wait 2 weeks for depo injection 2. Continue zoloft 3. Follow up in: 2 weeks or as needed.

## 2016-03-24 ENCOUNTER — Ambulatory Visit (INDEPENDENT_AMBULATORY_CARE_PROVIDER_SITE_OTHER): Payer: Self-pay

## 2016-03-24 DIAGNOSIS — Z3042 Encounter for surveillance of injectable contraceptive: Secondary | ICD-10-CM

## 2016-03-24 DIAGNOSIS — Z01812 Encounter for preprocedural laboratory examination: Secondary | ICD-10-CM

## 2016-03-24 MED ORDER — MEDROXYPROGESTERONE ACETATE 150 MG/ML IM SUSP
150.0000 mg | Freq: Once | INTRAMUSCULAR | Status: AC
  Administered 2016-03-24: 150 mg via INTRAMUSCULAR

## 2016-03-24 NOTE — Progress Notes (Signed)
Patient presents for UPT and Depo Provera contraception. Patient pregnancy test NEGATIVE. Patient confirms abstience x2weeks. Kathrene Alu RNBSN

## 2016-04-12 ENCOUNTER — Telehealth: Payer: Self-pay | Admitting: *Deleted

## 2016-04-12 NOTE — Telephone Encounter (Signed)
Pt called with c/o's bloating and bleeding.  She received the Depo Provera 150 mg on 03/24/16 and has been feeling this way since.  She is requesting an appt for evaluation.  In meantime I recommended Ibuprofen 600 mg every 6 hrs for cramping.  She stated that she is passing some clots this morning.

## 2016-04-13 ENCOUNTER — Ambulatory Visit: Payer: Self-pay | Admitting: Obstetrics & Gynecology

## 2016-04-13 DIAGNOSIS — R140 Abdominal distension (gaseous): Secondary | ICD-10-CM

## 2016-04-27 ENCOUNTER — Telehealth: Payer: Self-pay

## 2016-04-27 ENCOUNTER — Other Ambulatory Visit: Payer: Self-pay

## 2016-04-27 DIAGNOSIS — F329 Major depressive disorder, single episode, unspecified: Secondary | ICD-10-CM

## 2016-04-27 DIAGNOSIS — O99343 Other mental disorders complicating pregnancy, third trimester: Principal | ICD-10-CM

## 2016-04-27 DIAGNOSIS — F32A Depression, unspecified: Secondary | ICD-10-CM

## 2016-04-27 MED ORDER — SERTRALINE HCL 50 MG PO TABS: 50.0000 mg | ORAL_TABLET | Freq: Every day | ORAL | 3 refills | Status: DC

## 2016-04-27 NOTE — Telephone Encounter (Signed)
Patient called and requested that her prescription be sent to a new pharmacy. [facility] [address] in [facility]. New rx sent. Kathrene Alu RN BSN

## 2016-04-27 NOTE — Telephone Encounter (Signed)
Patient called back and would like script to go to new pharmacy. Resent sertaline script to pharmacy. Kathrene Alu RN BSN

## 2016-04-27 NOTE — Telephone Encounter (Signed)
Patient requested refill of Sertraline. Per Dr. Nehemiah Settle order placed. Kathrene Alu RN BSN

## 2016-06-16 ENCOUNTER — Ambulatory Visit: Payer: Self-pay | Admitting: Family Medicine

## 2016-07-18 ENCOUNTER — Ambulatory Visit (INDEPENDENT_AMBULATORY_CARE_PROVIDER_SITE_OTHER): Payer: Medicaid Other | Admitting: Obstetrics & Gynecology

## 2016-07-18 ENCOUNTER — Encounter: Payer: Self-pay | Admitting: Obstetrics & Gynecology

## 2016-07-18 VITALS — BP 117/57 | Ht 70.0 in | Wt 203.0 lb

## 2016-07-18 DIAGNOSIS — Z3202 Encounter for pregnancy test, result negative: Secondary | ICD-10-CM | POA: Diagnosis not present

## 2016-07-18 DIAGNOSIS — O99343 Other mental disorders complicating pregnancy, third trimester: Secondary | ICD-10-CM

## 2016-07-18 DIAGNOSIS — F3289 Other specified depressive episodes: Secondary | ICD-10-CM

## 2016-07-18 DIAGNOSIS — Z01812 Encounter for preprocedural laboratory examination: Secondary | ICD-10-CM

## 2016-07-18 DIAGNOSIS — F329 Major depressive disorder, single episode, unspecified: Secondary | ICD-10-CM

## 2016-07-18 DIAGNOSIS — Z30017 Encounter for initial prescription of implantable subdermal contraceptive: Secondary | ICD-10-CM | POA: Diagnosis not present

## 2016-07-18 DIAGNOSIS — F32A Depression, unspecified: Secondary | ICD-10-CM

## 2016-07-18 LAB — POCT URINE PREGNANCY: Preg Test, Ur: NEGATIVE

## 2016-07-18 MED ORDER — SERTRALINE HCL 100 MG PO TABS: 50.0000 mg | ORAL_TABLET | Freq: Every day | ORAL | 3 refills | Status: DC

## 2016-07-18 NOTE — Progress Notes (Signed)
Needs refill of Zoloft. Kathrene Alu RN BSN

## 2016-07-18 NOTE — Patient Instructions (Signed)
Etonogestrel implant What is this medicine? ETONOGESTREL (et oh noe JES trel) is a contraceptive (birth control) device. It is used to prevent pregnancy. It can be used for up to 3 years. This medicine may be used for other purposes; ask your health care provider or pharmacist if you have questions. What should I tell my health care provider before I take this medicine? They need to know if you have any of these conditions: -abnormal vaginal bleeding -blood vessel disease or blood clots -cancer of the breast, cervix, or liver -depression -diabetes -gallbladder disease -headaches -heart disease or recent heart attack -high blood pressure -high cholesterol -kidney disease -liver disease -renal disease -seizures -tobacco smoker -an unusual or allergic reaction to etonogestrel, other hormones, anesthetics or antiseptics, medicines, foods, dyes, or preservatives -pregnant or trying to get pregnant -breast-feeding How should I use this medicine? This device is inserted just under the skin on the inner side of your upper arm by a health care professional. Talk to your pediatrician regarding the use of this medicine in children. Special care may be needed. Overdosage: If you think you have taken too much of this medicine contact a poison control center or emergency room at once. NOTE: This medicine is only for you. Do not share this medicine with others. What if I miss a dose? This does not apply. What may interact with this medicine? Do not take this medicine with any of the following medications: -amprenavir -bosentan -fosamprenavir This medicine may also interact with the following medications: -barbiturate medicines for inducing sleep or treating seizures -certain medicines for fungal infections like ketoconazole and itraconazole -griseofulvin -medicines to treat seizures like carbamazepine, felbamate, oxcarbazepine, phenytoin,  topiramate -modafinil -phenylbutazone -rifampin -some medicines to treat HIV infection like atazanavir, indinavir, lopinavir, nelfinavir, tipranavir, ritonavir -St. John's wort This list may not describe all possible interactions. Give your health care provider a list of all the medicines, herbs, non-prescription drugs, or dietary supplements you use. Also tell them if you smoke, drink alcohol, or use illegal drugs. Some items may interact with your medicine. What should I watch for while using this medicine? This product does not protect you against HIV infection (AIDS) or other sexually transmitted diseases. You should be able to feel the implant by pressing your fingertips over the skin where it was inserted. Contact your doctor if you cannot feel the implant, and use a non-hormonal birth control method (such as condoms) until your doctor confirms that the implant is in place. If you feel that the implant may have broken or become bent while in your arm, contact your healthcare provider. What side effects may I notice from receiving this medicine? Side effects that you should report to your doctor or health care professional as soon as possible: -allergic reactions like skin rash, itching or hives, swelling of the face, lips, or tongue -breast lumps -changes in emotions or moods -depressed mood -heavy or prolonged menstrual bleeding -pain, irritation, swelling, or bruising at the insertion site -scar at site of insertion -signs of infection at the insertion site such as fever, and skin redness, pain or discharge -signs of pregnancy -signs and symptoms of a blood clot such as breathing problems; changes in vision; chest pain; severe, sudden headache; pain, swelling, warmth in the leg; trouble speaking; sudden numbness or weakness of the face, arm or leg -signs and symptoms of liver injury like dark yellow or brown urine; general ill feeling or flu-like symptoms; light-colored stools; loss of  appetite; nausea; right upper belly   pain; unusually weak or tired; yellowing of the eyes or skin -unusual vaginal bleeding, discharge -signs and symptoms of a stroke like changes in vision; confusion; trouble speaking or understanding; severe headaches; sudden numbness or weakness of the face, arm or leg; trouble walking; dizziness; loss of balance or coordination Side effects that usually do not require medical attention (Report these to your doctor or health care professional if they continue or are bothersome.): -acne -back pain -breast pain -changes in weight -dizziness -general ill feeling or flu-like symptoms -headache -irregular menstrual bleeding -nausea -sore throat -vaginal irritation or inflammation This list may not describe all possible side effects. Call your doctor for medical advice about side effects. You may report side effects to FDA at 1-800-FDA-1088. Where should I keep my medicine? This drug is given in a hospital or clinic and will not be stored at home. NOTE: This sheet is a summary. It may not cover all possible information. If you have questions about this medicine, talk to your doctor, pharmacist, or health care provider.    2016, [employer]vier/Gold Standard. (2014-07-04 14:07:06)  

## 2016-07-18 NOTE — Progress Notes (Signed)
Pt here for Nexplanon.  She also reports that her Zoloft does not seem to be working fully.  She says that her spouse reports that he notes a difference but, she still feels very angry at times.  She feels like the Zoloft has helped but, not completely She would like to consider increasing he dosage.  Patient given informed consent, she signed consent form. Pregnancy test was negative.  Appropriate time out taken.  Patient's left arm was prepped and draped in the usual sterile fashion.. The ruler used to measure and mark insertion area.  Patient was prepped with alcohol swab and then injected with 5 ml of 1 % lidocaine.  She was prepped with betadine, Nexplanon removed from packaging,  Device confirmed in needle, then inserted full length of needle and withdrawn per handbook instructions.  There was minimal blood loss.  Patient insertion site covered with guaze and a pressure bandage to reduce any bruising.  The patient tolerated the procedure well and was given post procedure instructions. Return in about one month for Nexplanon check.  Increased Zoloft to 100mg  daily F/u in 6 weeks to recheck depression and Nexplanon Pt to f/u sooner prn  Kehaulani Fruin L. Harraway-Smith, M.D., Cherlynn June

## 2016-07-25 ENCOUNTER — Encounter: Payer: Self-pay | Admitting: Obstetrics & Gynecology

## 2016-07-25 ENCOUNTER — Ambulatory Visit (INDEPENDENT_AMBULATORY_CARE_PROVIDER_SITE_OTHER): Payer: Medicaid Other | Admitting: Obstetrics & Gynecology

## 2016-07-25 VITALS — BP 117/76 | HR 78 | Wt 205.0 lb

## 2016-07-25 DIAGNOSIS — M79602 Pain in left arm: Secondary | ICD-10-CM

## 2016-07-25 NOTE — Patient Instructions (Signed)
Etonogestrel implant What is this medicine? ETONOGESTREL (et oh noe JES trel) is a contraceptive (birth control) device. It is used to prevent pregnancy. It can be used for up to 3 years. This medicine may be used for other purposes; ask your health care provider or pharmacist if you have questions. What should I tell my health care provider before I take this medicine? They need to know if you have any of these conditions: -abnormal vaginal bleeding -blood vessel disease or blood clots -cancer of the breast, cervix, or liver -depression -diabetes -gallbladder disease -headaches -heart disease or recent heart attack -high blood pressure -high cholesterol -kidney disease -liver disease -renal disease -seizures -tobacco smoker -an unusual or allergic reaction to etonogestrel, other hormones, anesthetics or antiseptics, medicines, foods, dyes, or preservatives -pregnant or trying to get pregnant -breast-feeding How should I use this medicine? This device is inserted just under the skin on the inner side of your upper arm by a health care professional. Talk to your pediatrician regarding the use of this medicine in children. Special care may be needed. Overdosage: If you think you have taken too much of this medicine contact a poison control center or emergency room at once. NOTE: This medicine is only for you. Do not share this medicine with others. What if I miss a dose? This does not apply. What may interact with this medicine? Do not take this medicine with any of the following medications: -amprenavir -bosentan -fosamprenavir This medicine may also interact with the following medications: -barbiturate medicines for inducing sleep or treating seizures -certain medicines for fungal infections like ketoconazole and itraconazole -griseofulvin -medicines to treat seizures like carbamazepine, felbamate, oxcarbazepine, phenytoin,  topiramate -modafinil -phenylbutazone -rifampin -some medicines to treat HIV infection like atazanavir, indinavir, lopinavir, nelfinavir, tipranavir, ritonavir -St. John's wort This list may not describe all possible interactions. Give your health care provider a list of all the medicines, herbs, non-prescription drugs, or dietary supplements you use. Also tell them if you smoke, drink alcohol, or use illegal drugs. Some items may interact with your medicine. What should I watch for while using this medicine? This product does not protect you against HIV infection (AIDS) or other sexually transmitted diseases. You should be able to feel the implant by pressing your fingertips over the skin where it was inserted. Contact your doctor if you cannot feel the implant, and use a non-hormonal birth control method (such as condoms) until your doctor confirms that the implant is in place. If you feel that the implant may have broken or become bent while in your arm, contact your healthcare provider. What side effects may I notice from receiving this medicine? Side effects that you should report to your doctor or health care professional as soon as possible: -allergic reactions like skin rash, itching or hives, swelling of the face, lips, or tongue -breast lumps -changes in emotions or moods -depressed mood -heavy or prolonged menstrual bleeding -pain, irritation, swelling, or bruising at the insertion site -scar at site of insertion -signs of infection at the insertion site such as fever, and skin redness, pain or discharge -signs of pregnancy -signs and symptoms of a blood clot such as breathing problems; changes in vision; chest pain; severe, sudden headache; pain, swelling, warmth in the leg; trouble speaking; sudden numbness or weakness of the face, arm or leg -signs and symptoms of liver injury like dark yellow or brown urine; general ill feeling or flu-like symptoms; light-colored stools; loss of  appetite; nausea; right upper belly   pain; unusually weak or tired; yellowing of the eyes or skin -unusual vaginal bleeding, discharge -signs and symptoms of a stroke like changes in vision; confusion; trouble speaking or understanding; severe headaches; sudden numbness or weakness of the face, arm or leg; trouble walking; dizziness; loss of balance or coordination Side effects that usually do not require medical attention (Report these to your doctor or health care professional if they continue or are bothersome.): -acne -back pain -breast pain -changes in weight -dizziness -general ill feeling or flu-like symptoms -headache -irregular menstrual bleeding -nausea -sore throat -vaginal irritation or inflammation This list may not describe all possible side effects. Call your doctor for medical advice about side effects. You may report side effects to FDA at 1-800-FDA-1088. Where should I keep my medicine? This drug is given in a hospital or clinic and will not be stored at home. NOTE: This sheet is a summary. It may not cover all possible information. If you have questions about this medicine, talk to your doctor, pharmacist, or health care provider.    2016, [employer]vier/Gold Standard. (2014-07-04 14:07:06)  

## 2016-07-25 NOTE — Progress Notes (Signed)
History:  20 y.o. G1P1001 here today for eval of pain/burning  in her left arm at the site of the Nexplanon. Pt had a Nexplanon placed on 07/18/2016.  She reports tat she initially had bruising that is improved.  She denies fever or chills. She called the after hours nurse who recommended that she be seen today.  The following portions of the patient's history were reviewed and updated as appropriate: allergies, current medications, past family history, past medical history, past social history, past surgical history and problem list.  Review of Systems:  Pertinent items are noted in HPI.  Objective:  Physical Exam Blood pressure 117/76, pulse 78, weight 205 lb (93 kg), unknown if currently breastfeeding. Gen: NAD Ext: left arm- slight bruising at the site of the Nexplanon.  There is a small scab at the insertion site.   Labs and Imaging No results found.  Assessment & Plan:  Left arm pain 1 week after insertion of Nexplanon.   Site looks as expected.    Rec f/u as prev scheduled.  Sherod Cisse L. Harraway-Smith, M.D., Cherlynn June

## 2016-08-15 ENCOUNTER — Ambulatory Visit: Payer: Medicaid Other | Admitting: Family Medicine

## 2016-08-29 ENCOUNTER — Ambulatory Visit (INDEPENDENT_AMBULATORY_CARE_PROVIDER_SITE_OTHER): Payer: Medicaid Other | Admitting: Family Medicine

## 2016-08-29 ENCOUNTER — Encounter: Payer: Self-pay | Admitting: Family Medicine

## 2016-08-29 DIAGNOSIS — F329 Major depressive disorder, single episode, unspecified: Secondary | ICD-10-CM

## 2016-08-29 DIAGNOSIS — O99343 Other mental disorders complicating pregnancy, third trimester: Secondary | ICD-10-CM

## 2016-08-29 DIAGNOSIS — F331 Major depressive disorder, recurrent, moderate: Secondary | ICD-10-CM

## 2016-08-29 MED ORDER — SERTRALINE HCL 100 MG PO TABS: 100.0000 mg | ORAL_TABLET | Freq: Every day | ORAL | 3 refills | Status: DC

## 2016-08-29 NOTE — Assessment & Plan Note (Signed)
?   Of Bipolar---advised to see behavioral health specialist for diagnosis and further treatment. Have increased her Zoloft.

## 2016-08-29 NOTE — Progress Notes (Signed)
   Subjective:    Patient ID: Emily Villanueva is a 20 y.o. female presenting with Follow-up (Zoloft)  on 08/29/2016  HPI: Here for f/u. In October, supposed to have her Zoloft increased to 100 mg. Per her partner she is not taking it at all now. She is not better. There is some debate about whether she is also Bipolar. Although no true mania.  Review of Systems  Constitutional: Negative for chills and fever.  Respiratory: Negative for shortness of breath.   Cardiovascular: Negative for chest pain.  Gastrointestinal: Negative for abdominal pain, nausea and vomiting.  Genitourinary: Negative for dysuria.  Skin: Negative for rash.  Psychiatric/Behavioral: Positive for agitation, dysphoric mood, sleep disturbance and suicidal ideas.      Objective:    BP (!) 124/58   Pulse (!) 107   Resp 16   Ht 5\' 10"  (1.778 m)   Wt 205 lb (93 kg)   Breastfeeding? Yes   BMI 29.41 kg/m  Physical Exam  Constitutional: She is oriented to person, place, and time. She appears well-developed and well-nourished. No distress.  HENT:  Head: Normocephalic and atraumatic.  Eyes: No scleral icterus.  Neck: Neck supple.  Cardiovascular: Normal rate.   Pulmonary/Chest: Effort normal.  Abdominal: Soft.  Neurological: She is alert and oriented to person, place, and time.  Skin: Skin is warm and dry.  Psychiatric: Her affect is labile. Her speech is not tangential. She is not withdrawn. Thought content is not paranoid and not delusional. She expresses no suicidal plans and no homicidal plans. She is attentive.        Assessment & Plan:   Problem List Items Addressed This Visit      Unprioritized   Depression, major, recurrent ([place])    ? Of Bipolar---advised to see behavioral health specialist for diagnosis and further treatment. Have increased her Zoloft.      Relevant Medications   sertraline (ZOLOFT) 100 MG tablet    Other Visit Diagnoses    Depression complicating pregnancy, antepartum, third  trimester       Relevant Medications   sertraline (ZOLOFT) 100 MG tablet      Total face-to-face time with patient: 15 minutes. Over 50% of encounter was spent on counseling and coordination of care. Return in about 4 weeks (around 09/26/2016) for a follow-up.  Donnamae Jude 08/29/2016 10:29 AM

## 2016-08-29 NOTE — Patient Instructions (Signed)
Postpartum Depression and Baby Blues The postpartum period begins right after the birth of a baby. During this time, there is often a great amount of joy and excitement. It is also a time of many changes in the life of the parents. Regardless of how many times a mother gives birth, each child brings new challenges and dynamics to the family. It is not unusual to have feelings of excitement along with confusing shifts in moods, emotions, and thoughts. All mothers are at risk of developing postpartum depression or the "baby blues." These mood changes can occur right after giving birth, or they may occur many months after giving birth. The baby blues or postpartum depression can be mild or severe. Additionally, postpartum depression can go away rather quickly, or it can be a long-term condition. What are the causes? Raised hormone levels and the rapid drop in those levels are thought to be a main cause of postpartum depression and the baby blues. A number of hormones change during and after pregnancy. Estrogen and progesterone usually decrease right after the delivery of your baby. The levels of thyroid hormone and various cortisol steroids also rapidly drop. Other factors that play a role in these mood changes include major life events and genetics. What increases the risk? If you have any of the following risks for the baby blues or postpartum depression, know what symptoms to watch out for during the postpartum period. Risk factors that may increase the likelihood of getting the baby blues or postpartum depression include:  Having a personal or family history of depression.  Having depression while being pregnant.  Having premenstrual mood issues or mood issues related to oral contraceptives.  Having a lot of life stress.  Having marital conflict.  Lacking a social support network.  Having a baby with special needs.  Having health problems, such as diabetes.  What are the signs or  symptoms? Symptoms of baby blues include:  Brief changes in mood, such as going from extreme happiness to sadness.  Decreased concentration.  Difficulty sleeping.  Crying spells, tearfulness.  Irritability.  Anxiety.  Symptoms of postpartum depression typically begin within the first month after giving birth. These symptoms include:  Difficulty sleeping or excessive sleepiness.  Marked weight loss.  Agitation.  Feelings of worthlessness.  Lack of interest in activity or food.  Postpartum psychosis is a very serious condition and can be dangerous. Fortunately, it is rare. Displaying any of the following symptoms is cause for immediate medical attention. Symptoms of postpartum psychosis include:  Hallucinations and delusions.  Bizarre or disorganized behavior.  Confusion or disorientation.  How is this diagnosed? A diagnosis is made by an evaluation of your symptoms. There are no medical or lab tests that lead to a diagnosis, but there are various questionnaires that a health care provider may use to identify those with the baby blues, postpartum depression, or psychosis. Often, a screening tool called the Edinburgh Postnatal Depression Scale is used to diagnose depression in the postpartum period. How is this treated? The baby blues usually goes away on its own in 1-2 weeks. Social support is often all that is needed. You will be encouraged to get adequate sleep and rest. Occasionally, you may be given medicines to help you sleep. Postpartum depression requires treatment because it can last several months or longer if it is not treated. Treatment may include individual or group therapy, medicine, or both to address any social, physiological, and psychological factors that may play a role in the   depression. Regular exercise, a healthy diet, rest, and social support may also be strongly recommended. Postpartum psychosis is more serious and needs treatment right away.  Hospitalization is often needed. Follow these instructions at home:  Get as much rest as you can. Nap when the baby sleeps.  Exercise regularly. Some women find yoga and walking to be beneficial.  Eat a balanced and nourishing diet.  Do little things that you enjoy. Have a cup of tea, take a bubble bath, read your favorite magazine, or listen to your favorite music.  Avoid alcohol.  Ask for help with household chores, cooking, grocery shopping, or running errands as needed. Do not try to do everything.  Talk to people close to you about how you are feeling. Get support from your partner, family members, friends, or other new moms.  Try to stay positive in how you think. Think about the things you are grateful for.  Do not spend a lot of time alone.  Only take over-the-counter or prescription medicine as directed by your health care provider.  Keep all your postpartum appointments.  Let your health care provider know if you have any concerns. Contact a health care provider if: You are having a reaction to or problems with your medicine. Get help right away if:  You have suicidal feelings.  You think you may harm the baby or someone else. This information is not intended to replace advice given to you by your health care provider. Make sure you discuss any questions you have with your health care provider. Document Released: 06/23/2004 Document Revised: 02/25/2016 Document Reviewed: 07/01/2013 Elsevier Interactive Patient Education  2017 Elsevier Inc.  

## 2016-09-28 ENCOUNTER — Ambulatory Visit: Payer: Medicaid Other | Admitting: Obstetrics & Gynecology

## 2016-10-31 ENCOUNTER — Ambulatory Visit (INDEPENDENT_AMBULATORY_CARE_PROVIDER_SITE_OTHER): Payer: Self-pay | Admitting: Family Medicine

## 2016-10-31 VITALS — BP 115/77 | HR 104 | Wt 209.0 lb

## 2016-10-31 DIAGNOSIS — F331 Major depressive disorder, recurrent, moderate: Secondary | ICD-10-CM

## 2016-10-31 NOTE — Progress Notes (Signed)
   Subjective:    Patient ID: Emily Villanueva is a 21 y.o. female presenting with Follow-up (zoloft)  on 10/31/2016  HPI: Reports that her mood is not improved with the increasing dosage of Zoloft. Still feels very angry. Does not feel suicidal. Has some OCD tendencies.  Review of Systems  Constitutional: Negative for chills and fever.  Respiratory: Negative for shortness of breath.   Cardiovascular: Negative for chest pain.  Gastrointestinal: Negative for abdominal pain, nausea and vomiting.  Genitourinary: Negative for dysuria.  Skin: Negative for rash.  Psychiatric/Behavioral: Positive for behavioral problems, decreased concentration, dysphoric mood and sleep disturbance. Negative for suicidal ideas.      Objective:    BP 115/77   Pulse (!) 104   Wt 209 lb (94.8 kg)   BMI 29.99 kg/m  Physical Exam  Constitutional: She is oriented to person, place, and time. She appears well-developed and well-nourished. No distress.  HENT:  Head: Normocephalic and atraumatic.  Eyes: No scleral icterus.  Neck: Neck supple.  Cardiovascular: Normal rate.   Pulmonary/Chest: Effort normal.  Abdominal: Soft.  Neurological: She is alert and oriented to person, place, and time.  Skin: Skin is warm and dry.  Psychiatric: Her speech is not rapid and/or pressured and not delayed. She is not agitated and not aggressive. She expresses no suicidal plans and no homicidal plans. She exhibits normal recent memory.        Assessment & Plan:   Problem List Items Addressed This Visit      Unprioritized   Depression, major, recurrent ([place]) - Primary    Not improved with dosage adjustments--to f/u with psych referral to [facility]      Relevant Orders   Ambulatory referral to Psychiatry      Total face-to-face time with patient: 15 minutes. Over 50% of encounter was spent on counseling and coordination of care. Return in about 3 months (around 01/29/2017) for a follow-up.  Donnamae Jude 10/31/2016 10:01  AM

## 2016-10-31 NOTE — Patient Instructions (Signed)

## 2016-10-31 NOTE — Assessment & Plan Note (Signed)
Not improved with dosage adjustments--to f/u with psych referral to [facility]

## 2016-10-31 NOTE — Progress Notes (Signed)
Pt states she does not feel much better with Zoloft.

## 2017-01-30 ENCOUNTER — Ambulatory Visit: Payer: Medicaid Other | Admitting: Obstetrics & Gynecology

## 2017-07-17 ENCOUNTER — Encounter: Payer: Self-pay | Admitting: Obstetrics & Gynecology

## 2017-07-17 ENCOUNTER — Other Ambulatory Visit (HOSPITAL_COMMUNITY)
Admission: RE | Admit: 2017-07-17 | Discharge: 2017-07-17 | Disposition: A | Discharge status: Home / Self Care | Payer: Medicaid Other | Source: Ambulatory Visit | Attending: Obstetrics & Gynecology | Admitting: Obstetrics & Gynecology

## 2017-07-17 ENCOUNTER — Ambulatory Visit (INDEPENDENT_AMBULATORY_CARE_PROVIDER_SITE_OTHER): Payer: Medicaid Other | Admitting: Obstetrics & Gynecology

## 2017-07-17 VITALS — BP 132/78 | HR 105 | Wt 220.0 lb

## 2017-07-17 DIAGNOSIS — Z01419 Encounter for gynecological examination (general) (routine) without abnormal findings: Secondary | ICD-10-CM

## 2017-07-17 DIAGNOSIS — R42 Dizziness and giddiness: Secondary | ICD-10-CM

## 2017-07-17 DIAGNOSIS — Z309 Encounter for contraceptive management, unspecified: Secondary | ICD-10-CM

## 2017-07-17 DIAGNOSIS — Z3009 Encounter for other general counseling and advice on contraception: Secondary | ICD-10-CM

## 2017-07-17 NOTE — Progress Notes (Signed)
Subjective:     Emily Villanueva is a 21 y.o. female here for a routine exam.  Current complaints: pt reports pain in her arm with the Nexplanon.  Pt reports that she has been feeling dizzy. She reports a h/o dizziness for years that has never been formally evaluated. She reports that she is bipolar but has not taken her meds 'in a minute.'  Pt reports weight gain and irreg bleeding on Nexplanon.   Gynecologic History No LMP recorded. Contraception: Nexplanon Last Pap: never had.  Last mammogram: n/a.  Obstetric History OB History  Gravida Para Term Preterm AB Living  1 1 1     1   SAB TAB Ectopic Multiple Live Births        0 1    # Outcome Date GA Lbr Len/2nd Weight Sex Delivery Anes PTL Lv  1 Term 12/23/15 [redacted]w[redacted]d 13:14 / 01:32 8 lb 5.7 oz (3.79 kg) F Vag-Spont EPI  LIV     The following portions of the patient's history were reviewed and updated as appropriate: allergies, current medications, past family history, past medical history, past social history, past surgical history and problem list.  Review of Systems Pertinent items are noted in HPI.    Objective:  BP 132/78   Pulse (!) 105   Wt 220 lb (99.8 kg)   BMI 31.57 kg/m   General Appearance:    Alert, cooperative, no distress, appears stated age  Head:    Normocephalic, without obvious abnormality, atraumatic  Eyes:    conjunctiva/corneas clear, EOM's intact, both eyes  Ears:    Normal external ear canals, both ears  Nose:   Nares normal, septum midline, mucosa normal, no drainage    or sinus tenderness Mouth: pt has a tongue ring  Throat:   Lips, mucosa, and tongue normal; teeth and gums normal  Neck:   Supple, symmetrical, trachea midline, no adenopathy;    thyroid:  no enlargement/tenderness/nodules  Back:     Symmetric, no curvature, ROM normal, no CVA tenderness  Lungs:     Clear to auscultation bilaterally, respirations unlabored  Chest Wall:    No tenderness or deformity   Heart:    Regular rate and rhythm, S1  and S2 normal, no murmur, rub   or gallop  Breast Exam:    No tenderness, masses, or nipple abnormality  Abdomen:     Soft, non-tender, bowel sounds active all four quadrants,    no masses, no organomegaly  Genitalia:    Normal female without lesion, discharge or tenderness     Extremities:   Extremities normal, atraumatic, no cyanosis or edema  Pulses:   2+ and symmetric all extremities  Skin:   Skin color, texture, turgor normal, no rashes or lesions; multiple tattoos       Assessment:    Healthy female exam.   Contraception counseling- pt would like to have the Nexplanon removed and the LnIUD placed Dizziness- etiology unk. Pt given info for referral to the [facility] as she had [facility] only medicaid.    Plan:   F/u PAP with cx F/u for Nexplanon removal and LnIUD placement at next visit  [place]. Harraway-Smith, M.D., Cherlynn June

## 2017-07-17 NOTE — Progress Notes (Signed)
Pt states that she is here today for pap smear, will reschedule to have BC removed.  Nexplanon inserted in Oct 2017, pt would like removed due to side effects she is having. Pt states that she has had irregular cycles, sleep problems and mood swings since Nexplanon inserted. Pt is dizzy in office today, ?vertigo per pt.

## 2017-07-17 NOTE — Addendum Note (Signed)
Addended by: Lyndal Rainbow on: 07/17/2017 12:04 PM   Modules accepted: Orders

## 2017-07-17 NOTE — Addendum Note (Signed)
Addended by: Lewie Loron D on: 07/17/2017 12:02 PM   Modules accepted: Orders

## 2017-07-19 LAB — CYTOLOGY - PAP
Chlamydia: NEGATIVE
DIAGNOSIS: NEGATIVE
Neisseria Gonorrhea: NEGATIVE

## 2017-07-24 ENCOUNTER — Ambulatory Visit (INDEPENDENT_AMBULATORY_CARE_PROVIDER_SITE_OTHER): Payer: Medicaid Other | Admitting: Family Medicine

## 2017-07-24 ENCOUNTER — Encounter: Payer: Self-pay | Admitting: Family Medicine

## 2017-07-24 VITALS — BP 119/83 | HR 109 | Ht 70.0 in | Wt 220.0 lb

## 2017-07-24 DIAGNOSIS — Z309 Encounter for contraceptive management, unspecified: Secondary | ICD-10-CM

## 2017-07-24 DIAGNOSIS — Z01812 Encounter for preprocedural laboratory examination: Secondary | ICD-10-CM

## 2017-07-24 LAB — POCT URINE PREGNANCY: Preg Test, Ur: NEGATIVE

## 2017-07-24 MED ORDER — LEVONORGESTREL 18.6 MCG/DAY IU IUD
INTRAUTERINE_SYSTEM | Freq: Once | INTRAUTERINE | Status: AC
  Administered 2017-07-24: 1 via INTRAUTERINE

## 2017-07-24 NOTE — Addendum Note (Signed)
Addended by: Phill Myron on: 07/24/2017 10:34 AM   Modules accepted: Orders

## 2017-07-24 NOTE — Progress Notes (Signed)
  IUD Procedure Note Patient identified, informed consent performed, signed copy in chart, time out was performed.  Urine pregnancy test negative.  Speculum placed in the vagina.  Cervix visualized.  Cleaned with Betadine x 2.  Grasped anteriorly with a single tooth tenaculum.  Uterus sounded to 8 cm.  Liletta  IUD placed per manufacturer's recommendations.  Strings trimmed to 3 cm. Tenaculum was removed, good hemostasis noted.  Patient tolerated procedure well.   Patient given post procedure instructions and Liletta care card with expiration date.  Patient is asked to check IUD strings periodically and follow up in 4-6 weeks for IUD check.  Nexplanon Removal:  Patient given informed consent for removal of her Implanon, time out was performed.  Signed copy in the chart.  Appropriate time out taken. Implanon site identified.  Area prepped in usual sterile fashon. One cc of 1% lidocaine was used to anesthetize the area at the distal end of the implant. A small stab incision was made right beside the implant on the distal portion.  The implanon rod was grasped using hemostats and removed without difficulty.  There was less than 3 cc blood loss. There were no complications.  A small amount of antibiotic ointment and steri-strips were applied over the small incision.  A pressure bandage was applied to reduce any bruising.  The patient tolerated the procedure well and was given post procedure instructions.

## 2017-08-21 ENCOUNTER — Encounter: Payer: Self-pay | Admitting: Obstetrics & Gynecology

## 2017-08-21 ENCOUNTER — Ambulatory Visit (INDEPENDENT_AMBULATORY_CARE_PROVIDER_SITE_OTHER): Payer: Medicaid Other | Admitting: Obstetrics & Gynecology

## 2017-08-21 VITALS — BP 103/79 | HR 110 | Wt 223.0 lb

## 2017-08-21 DIAGNOSIS — Z309 Encounter for contraceptive management, unspecified: Secondary | ICD-10-CM | POA: Diagnosis not present

## 2017-08-21 DIAGNOSIS — Z30431 Encounter for routine checking of intrauterine contraceptive device: Secondary | ICD-10-CM

## 2017-08-21 NOTE — Patient Instructions (Signed)

## 2017-08-21 NOTE — Progress Notes (Signed)
History:  21 y.o. G1P1001 here today for today for IUD string check; Mirena IUD was placed 07/24/2017. No complaints about the Mirena, no concerning side effects.  The following portions of the patient's history were reviewed and updated as appropriate: allergies, current medications, past family history, past medical history, past social history, past surgical history and problem list. Last pap smear on 07/17/2017 was normal.  Review of Systems:  Pertinent items are noted in HPI.   Objective:  Physical Exam Blood pressure 103/79, pulse (!) 110, weight 223 lb (101.2 kg), currently breastfeeding. Gen: NAD Abd: Soft, nontender and nondistended Pelvic: Normal appearing external genitalia; normal appearing vaginal mucosa and cervix.  IUD strings visualized, about 3 cm in length outside cervix.   Assessment & Plan:  Normal IUD check. Patient to keep IUD in place for five years; can come in for removal if she desires pregnancy within the next five years. Routine preventative health maintenance measures emphasized.  Total face-to-face time with patient was 10 min.  Greater than 50% was spent in counseling and coordination of care with the patient.  Shaquasha Gerstel L. Harraway-Smith, M.D., Cherlynn June

## 2018-12-06 ENCOUNTER — Ambulatory Visit: Payer: Medicaid Other | Admitting: Family Medicine

## 2019-09-06 ENCOUNTER — Ambulatory Visit: Payer: Medicaid Other | Admitting: Cardiology

## 2019-09-11 ENCOUNTER — Other Ambulatory Visit: Payer: Self-pay | Admitting: *Deleted

## 2019-09-11 ENCOUNTER — Encounter: Payer: Self-pay | Admitting: Cardiology

## 2019-09-11 ENCOUNTER — Encounter: Payer: Self-pay | Admitting: *Deleted

## 2019-09-13 ENCOUNTER — Ambulatory Visit (INDEPENDENT_AMBULATORY_CARE_PROVIDER_SITE_OTHER): Payer: Medicaid Other | Admitting: Cardiology

## 2019-09-13 ENCOUNTER — Encounter: Payer: Self-pay | Admitting: Cardiology

## 2019-09-13 ENCOUNTER — Other Ambulatory Visit: Payer: Self-pay

## 2019-09-13 VITALS — BP 100/76 | HR 111 | Ht 70.0 in | Wt 198.0 lb

## 2019-09-13 DIAGNOSIS — R55 Syncope and collapse: Secondary | ICD-10-CM | POA: Diagnosis not present

## 2019-09-13 NOTE — Patient Instructions (Addendum)
Medication Instructions:  Your physician recommends that you continue on your current medications as directed. Please refer to the Current Medication list given to you today.  *If you need a refill on your cardiac medications before your next appointment, please call your pharmacy*  Lab Work: Your physician recommends that you return for lab work when you are able/before next appt.   Thyroid/TSH panel  If you have labs (blood work) drawn today and your tests are completely normal, you will receive your results only by: Marland Kitchen MyChart Message (if you have MyChart) OR . A paper copy in the mail If you have any lab test that is abnormal or we need to change your treatment, we will call you to review the results.  Testing/Procedures: Your physician has requested that you have an echocardiogram. Echocardiography is a painless test that uses sound waves to create images of your heart. It provides your doctor with information about the size and shape of your heart and how well your heart's chambers and valves are working. This procedure takes approximately one hour. There are no restrictions for this procedure.    Follow-Up: At [facility], you and your health needs are our priority.  As part of our continuing mission to provide you with exceptional heart care, we have created designated Provider Care Teams.  These Care Teams include your primary Cardiologist (physician) and Advanced Practice Providers (APPs -  Physician Assistants and Nurse Practitioners) who all work together to provide you with the care you need, when you need it.  Your next appointment:    3 months  The format for your next appointment:   In Person  Provider:   Berniece Salines, DO  Other Instructions   Echocardiogram An echocardiogram is a procedure that uses painless sound waves (ultrasound) to produce an image of the heart. Images from an echocardiogram can provide important information about:  Signs of coronary artery  disease (CAD).  Aneurysm detection. An aneurysm is a weak or damaged part of an artery wall that bulges out from the normal force of blood pumping through the body.  Heart size and shape. Changes in the size or shape of the heart can be associated with certain conditions, including heart failure, aneurysm, and CAD.  Heart muscle function.  Heart valve function.  Signs of a past heart attack.  Fluid buildup around the heart.  Thickening of the heart muscle.  A tumor or infectious growth around the heart valves. Tell a health care provider about:  Any allergies you have.  All medicines you are taking, including vitamins, herbs, eye drops, creams, and over-the-counter medicines.  Any blood disorders you have.  Any surgeries you have had.  Any medical conditions you have.  Whether you are pregnant or may be pregnant. What are the risks? Generally, this is a safe procedure. However, problems may occur, including:  Allergic reaction to dye (contrast) that may be used during the procedure. What happens before the procedure? No specific preparation is needed. You may eat and drink normally. What happens during the procedure?   An IV tube may be inserted into one of your veins.  You may receive contrast through this tube. A contrast is an injection that improves the quality of the pictures from your heart.  A gel will be applied to your chest.  A wand-like tool (transducer) will be moved over your chest. The gel will help to transmit the sound waves from the transducer.  The sound waves will harmlessly bounce off  bounce off of your heart to allow the heart images to be captured in real-time motion. The images will be recorded on a computer. The procedure may vary among health care providers and hospitals. What happens after the procedure?  You may return to your normal, everyday life, including diet, activities, and medicines, unless your health care provider tells you not to do  that. Summary  An echocardiogram is a procedure that uses painless sound waves (ultrasound) to produce an image of the heart.  Images from an echocardiogram can provide important information about the size and shape of your heart, heart muscle function, heart valve function, and fluid buildup around your heart.  You do not need to do anything to prepare before this procedure. You may eat and drink normally.  After the echocardiogram is completed, you may return to your normal, everyday life, unless your health care provider tells you not to do that. This information is not intended to replace advice given to you by your health care provider. Make sure you discuss any questions you have with your health care provider. Document Released: 09/16/2000 Document Revised: 01/10/2019 Document Reviewed: 10/22/2016 Elsevier Patient Education  2020 Elsevier Inc.   
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
## 2019-09-19 ENCOUNTER — Ambulatory Visit (HOSPITAL_BASED_OUTPATIENT_CLINIC_OR_DEPARTMENT_OTHER): Admission: RE | Admit: 2019-09-19 | Payer: Medicaid Other | Source: Ambulatory Visit

## 2019-12-06 ENCOUNTER — Ambulatory Visit: Payer: Medicaid Other | Admitting: Cardiology

## 2022-08-17 ENCOUNTER — Emergency Department (HOSPITAL_BASED_OUTPATIENT_CLINIC_OR_DEPARTMENT_OTHER)
Admission: EM | Admit: 2022-08-17 | Discharge: 2022-08-17 | Disposition: A | Discharge status: Home / Self Care | Payer: Medicaid Other | Attending: Emergency Medicine | Admitting: Emergency Medicine

## 2022-08-17 ENCOUNTER — Emergency Department (HOSPITAL_BASED_OUTPATIENT_CLINIC_OR_DEPARTMENT_OTHER): Payer: Medicaid Other

## 2022-08-17 ENCOUNTER — Ambulatory Visit: Payer: Self-pay | Admitting: *Deleted

## 2022-08-17 ENCOUNTER — Other Ambulatory Visit: Payer: Self-pay

## 2022-08-17 ENCOUNTER — Encounter (HOSPITAL_BASED_OUTPATIENT_CLINIC_OR_DEPARTMENT_OTHER): Payer: Self-pay

## 2022-08-17 DIAGNOSIS — R1032 Left lower quadrant pain: Secondary | ICD-10-CM | POA: Insufficient documentation

## 2022-08-17 DIAGNOSIS — F17200 Nicotine dependence, unspecified, uncomplicated: Secondary | ICD-10-CM | POA: Diagnosis not present

## 2022-08-17 LAB — CBC WITH DIFFERENTIAL/PLATELET
Abs Immature Granulocytes: 0.03 10*3/uL (ref 0.00–0.07)
Basophils Absolute: 0 10*3/uL (ref 0.0–0.1)
Basophils Relative: 0 %
Eosinophils Absolute: 0.2 10*3/uL (ref 0.0–0.5)
Eosinophils Relative: 3 %
HCT: 40.2 % (ref 36.0–46.0)
Hemoglobin: 13.7 g/dL (ref 12.0–15.0)
Immature Granulocytes: 0 %
Lymphocytes Relative: 30 %
Lymphs Abs: 2.9 10*3/uL (ref 0.7–4.0)
MCH: 31.4 pg (ref 26.0–34.0)
MCHC: 34.1 g/dL (ref 30.0–36.0)
MCV: 92 fL (ref 80.0–100.0)
Monocytes Absolute: 0.5 10*3/uL (ref 0.1–1.0)
Monocytes Relative: 5 %
Neutro Abs: 5.8 10*3/uL (ref 1.7–7.7)
Neutrophils Relative %: 62 %
Platelets: 267 10*3/uL (ref 150–400)
RBC: 4.37 MIL/uL (ref 3.87–5.11)
RDW: 11.9 % (ref 11.5–15.5)
WBC: 9.6 10*3/uL (ref 4.0–10.5)
nRBC: 0 % (ref 0.0–0.2)

## 2022-08-17 LAB — COMPREHENSIVE METABOLIC PANEL
ALT: 22 U/L (ref 0–44)
AST: 24 U/L (ref 15–41)
Albumin: 4 g/dL (ref 3.5–5.0)
Alkaline Phosphatase: 67 U/L (ref 38–126)
Anion gap: 10 (ref 5–15)
BUN: 12 mg/dL (ref 6–20)
CO2: 27 mmol/L (ref 22–32)
Calcium: 9.1 mg/dL (ref 8.9–10.3)
Chloride: 103 mmol/L (ref 98–111)
Creatinine, Ser: 0.69 mg/dL (ref 0.44–1.00)
GFR, Estimated: >60 mL/min (ref >60)
Glucose, Bld: 90 mg/dL (ref 70–99)
Potassium: 3.7 mmol/L (ref 3.5–5.1)
Sodium: 140 mmol/L (ref 135–145)
Total Bilirubin: 0.8 mg/dL (ref 0.3–1.2)
Total Protein: 7 g/dL (ref 6.5–8.1)

## 2022-08-17 LAB — WET PREP, GENITAL
Sperm: NONE SEEN
Trich, Wet Prep: NONE SEEN
WBC, Wet Prep HPF POC: >=10 — AB (ref <10)
Yeast Wet Prep HPF POC: NONE SEEN

## 2022-08-17 LAB — URINALYSIS, ROUTINE W REFLEX MICROSCOPIC
Bilirubin Urine: NEGATIVE
Glucose, UA: NEGATIVE mg/dL
Hgb urine dipstick: NEGATIVE
Ketones, ur: NEGATIVE mg/dL
Leukocytes,Ua: NEGATIVE
Nitrite: NEGATIVE
Protein, ur: NEGATIVE mg/dL
Specific Gravity, Urine: 1.025 (ref 1.005–1.030)
pH: 6.5 (ref 5.0–8.0)

## 2022-08-17 LAB — LIPASE, BLOOD: Lipase: 30 U/L (ref 11–51)

## 2022-08-17 LAB — PREGNANCY, URINE: Preg Test, Ur: NEGATIVE

## 2022-08-17 MED ORDER — DICYCLOMINE HCL 20 MG PO TABS
20.0000 mg | ORAL_TABLET | Freq: Three times a day (TID) | ORAL | 0 refills | Status: DC | PRN
  Filled 2022-08-17: qty 20, 7d supply, fill #0

## 2022-08-17 NOTE — Discharge Instructions (Signed)

## 2022-08-17 NOTE — ED Provider Notes (Signed)
Emergency Department Provider Note   I have reviewed the triage vital signs and the nursing notes.   HISTORY  Chief Complaint Abdominal Pain   HPI Emily Villanueva is a 26 y.o. female with past history reviewed below presents to the emergency department for evaluation of left lower quadrant abdominal pain for the past 2 to 3 weeks.  Pain is steadily worsened currently 7 out of 10 in severity.  She had some more mild discomfort in the past that she attributed to gluten intolerance and other food allergies but this pain feels different and worse.  No significant vaginal bleeding or discharge.  She does have an IUD and with that only gets occasional spotting rather than regular menstrual cycles.  No dysuria, hesitancy, urgency.  No upper abdominal pain.  No vomiting or diarrhea.  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 ENT: No sore throat. Cardiovascular: Denies chest pain. Respiratory: Denies shortness of breath. Gastrointestinal: Positive LLQ abdominal pain.  No nausea, no vomiting.  No diarrhea.  No constipation. Genitourinary: Negative for dysuria. Musculoskeletal: Negative for back pain. Skin: Negative for rash. Neurological: Negative for headaches, focal weakness or numbness.   ____________________________________________   PHYSICAL EXAM:  VITAL SIGNS: ED Triage Vitals  Enc Vitals Group     BP 08/17/22 1142 125/78     Pulse Rate 08/17/22 1142 (!) 104     Resp 08/17/22 1142 17     Temp 08/17/22 1142 98 F (36.7 C)     Temp Source 08/17/22 1142 Oral     SpO2 08/17/22 1142 97 %     Weight 08/17/22 1146 236 lb (107 kg)     Height 08/17/22 1146 '5\' 10"'$   (1.778 m)   Constitutional: Alert and oriented. Well appearing and in no acute distress. Eyes: Conjunctivae are normal.  Head: Atraumatic. Nose: No congestion/rhinnorhea. Mouth/Throat: Mucous membranes are moist.  Neck: No stridor.   Cardiovascular: Normal rate, regular rhythm. Good peripheral circulation. Grossly normal heart sounds.   Respiratory: Normal respiratory effort.  No retractions. Lungs CTAB. Gastrointestinal: Soft with mild tenderness in the LLQ. No peritonitis. No distention.  Genitourinary: Exam performed with patient's verbal consent and nurse chaperone.  Normal external genitalia.  No vaginal bleeding.  No cervical motion tenderness.  Tenderness in the left adnexa without palpable mass. Musculoskeletal: No lower extremity tenderness nor edema. No gross deformities of extremities. Neurologic:  Normal speech and language. No gross focal neurologic deficits are appreciated.  Skin:  Skin is warm, dry and intact. No rash noted.  ____________________________________________   LABS (all labs ordered are listed, but only abnormal results are displayed)  Labs Reviewed  WET PREP, GENITAL - Abnormal; Notable for the following components:      Result Value   Clue Cells Wet Prep HPF POC PRESENT (*)    WBC, Wet Prep HPF POC >=10 (*)    All other components within normal limits  PREGNANCY, URINE  URINALYSIS, ROUTINE W REFLEX MICROSCOPIC  COMPREHENSIVE METABOLIC PANEL  LIPASE, BLOOD  CBC WITH DIFFERENTIAL/PLATELET  GC/CHLAMYDIA PROBE AMP ([place]) NOT AT [facility]    ____________________________________________   PROCEDURES  Procedure(s) performed:   Procedures  None  ____________________________________________   INITIAL IMPRESSION / ASSESSMENT AND PLAN / ED COURSE  Pertinent labs & imaging results that were available during my care of the patient were reviewed by me and considered in my medical decision making (see chart for details).   This patient is Presenting  for Evaluation of abdominal pain, which does require a range of treatment options, and is a complaint that involves a high risk of morbidity and mortality.  The Differential Diagnoses includes but is not exclusive to ectopic pregnancy, ovarian cyst, ovarian torsion, acute appendicitis, urinary tract infection, endometriosis, bowel obstruction, hernia, colitis, renal colic, gastroenteritis, volvulus etc.   Clinical Laboratory Tests Ordered, included pregnancy negative and UA without infection.   Radiologic Tests Ordered, included US pelvis. I independently interpreted the images and agree with radiology interpretation.   Social Determinants of Health Risk patient is a smoker.   Medical Decision Making: Summary:  Patient presents emergency department with lower abdominal pain in the left lower abdomen.  No evidence on exam to suggest PID.  Mild tenderness on external palpation of the left abdomen.  Plan for transvaginal ultrasound and lab work.  Likely defer CT imaging for now but reassess after initial imaging.  Reevaluation with update and discussion with patient. US unremarkable. Pain improved. Plan for close PCP follow up.   Disposition: discharge  ____________________________________________  FINAL CLINICAL IMPRESSION(S) / ED DIAGNOSES  Final diagnoses:  LLQ abdominal pain     NEW OUTPATIENT MEDICATIONS STARTED DURING THIS VISIT:  Discharge Medication List as of 08/17/2022  6:32 PM     START taking these medications   Details  dicyclomine (BENTYL) 20 MG tablet Take 1 tablet (20 mg total) by mouth 3 (three) times daily as needed for spasms., Starting Wed 08/17/2022, Normal        Note:  This document was prepared using Dragon voice recognition software and may include unintentional dictation errors.  Nanda Quinton, MD, [facility]    Aureliano Oshields, [facility], MD 08/19/22 [phone]

## 2022-08-17 NOTE — ED Triage Notes (Signed)
Pt reports Abdominal pain 3-4 weeks.  Pain is crampy. Pt reports unable to feel IUD strings. No heavy bleeding only spotting. Pain with increases with sitting

## 2022-08-17 NOTE — Telephone Encounter (Signed)
  Chief Complaint: Abdominal pain Symptoms: lower abdominal pain left to right 7/10 constant, worsening. Sharp when sitting. Has IUD, cannot find string, 2017 placed Frequency: 3 weeks, worse today Pertinent Negatives: Patient denies fever Disposition: '[x]'$ ED /'[]'$ Urgent Care (no appt availability in office) / '[]'$ Appointment(In office/virtual)/ '[]'$  [place] [facility]/ '[]'$ Home Care/ '[]'$ Refused Recommended Disposition /'[]'$ [place] [facility]/ '[]'$  Follow-up with PCP Additional Notes: Advised ED, states will follow disposition. No PCP. Care advise provided, pt verbalizes understanding. Reason for Disposition  [1] SEVERE pain (e.g., excruciating) AND [2] present > 1 hour  Answer Assessment - Initial Assessment Questions 1. LOCATION: "Where does it hurt?"      Lower abdomen, left to right 2. RADIATION: "Does the pain shoot anywhere else?" (e.g., chest, back)     Started on left, moving to right 3. ONSET: "When did the pain begin?" (e.g., minutes, hours or days ago)      3-4 weeks, worsening 4. SUDDEN: "Gradual or sudden onset?"     Suddenly 5. PATTERN "Does the pain come and go, or is it constant?"    - If it comes and goes: "How long does it last?" "Do you have pain now?"     (Note: Comes and goes means the pain is intermittent. It goes away completely between bouts.)    - If constant: "Is it getting better, staying the same, or getting worse?"      (Note: Constant means the pain never goes away completely; most serious pain is constant and gets worse.)      Constant 6. SEVERITY: "How bad is the pain?"  (e.g., Scale 1-10; mild, moderate, or severe)    - MILD (1-3): Doesn't interfere with normal activities, abdomen soft and not tender to touch.     - MODERATE (4-7): Interferes with normal activities or awakens from sleep, abdomen tender to touch.     - SEVERE (8-10): Excruciating pain, doubled over, unable to do any normal activities.       7/10 7. RECURRENT SYMPTOM: "Have you ever had this  type of stomach pain before?" If Yes, ask: "When was the last time?" and "What happened that time?"      no 8. CAUSE: "What do you think is causing the stomach pain?"     IUD 9. RELIEVING/AGGRAVATING FACTORS: "What makes it better or worse?" (e.g., antacids, bending or twisting motion, bowel movement)     Worse sitting. Lean forward sharp pain. 10. OTHER SYMPTOMS: "Do you have any other symptoms?" (e.g., back pain, diarrhea, fever, urination pain, vomiting)       Lower back pain, last week with diarrhea, felt like the flu, covid neg.  Protocols used: Abdominal Pain - Female-A-AH

## 2022-08-18 ENCOUNTER — Other Ambulatory Visit (HOSPITAL_BASED_OUTPATIENT_CLINIC_OR_DEPARTMENT_OTHER): Payer: Self-pay

## 2022-08-18 LAB — GC/CHLAMYDIA PROBE AMP (~~LOC~~) NOT AT ARMC
Chlamydia: NEGATIVE
Comment: NEGATIVE
Comment: NORMAL
Neisseria Gonorrhea: NEGATIVE

## 2022-10-12 ENCOUNTER — Encounter: Payer: Self-pay | Admitting: Family Medicine

## 2022-10-12 ENCOUNTER — Other Ambulatory Visit (HOSPITAL_COMMUNITY)
Admission: RE | Admit: 2022-10-12 | Discharge: 2022-10-12 | Disposition: A | Discharge status: Home / Self Care | Payer: Medicaid Other | Source: Ambulatory Visit | Attending: Family Medicine | Admitting: Family Medicine

## 2022-10-12 ENCOUNTER — Ambulatory Visit: Payer: Medicaid Other | Admitting: Family Medicine

## 2022-10-12 VITALS — BP 106/70 | HR 97 | Ht 70.0 in | Wt 242.0 lb

## 2022-10-12 DIAGNOSIS — Z01419 Encounter for gynecological examination (general) (routine) without abnormal findings: Secondary | ICD-10-CM

## 2022-10-12 DIAGNOSIS — N644 Mastodynia: Secondary | ICD-10-CM

## 2022-10-12 DIAGNOSIS — D229 Melanocytic nevi, unspecified: Secondary | ICD-10-CM

## 2022-10-12 DIAGNOSIS — Z30431 Encounter for routine checking of intrauterine contraceptive device: Secondary | ICD-10-CM

## 2022-10-12 NOTE — Progress Notes (Signed)
   ANNUAL EXAM Patient name: Emily Villanueva MRN [id number]  Date of birth: 1996/04/17 Chief Complaint:   Annual Exam  History of Present Illness:   Emily Villanueva is a 26 y.o.  G21P1001  female  being seen today for a routine annual exam.  Current complaints: breast tenderness for the past few weeks. Has IUD placed 5 years ago.   No LMP recorded. (Menstrual status: IUD).    Last pap 07/2017. Results were:  normal . H/O abnormal pap: no Last mammogram: n/a.     09/18/2015    2:09 PM  Depression screen PHQ 2/9  Decreased Interest 0  Down, Depressed, Hopeless 0  PHQ - 2 Score 0         No data to display           Review of Systems:   Pertinent items are noted in HPI Denies any headaches, blurred vision, fatigue, shortness of breath, chest pain, abdominal pain, abnormal vaginal discharge/itching/odor/irritation, problems with periods, bowel movements, urination, or intercourse unless otherwise stated above. Pertinent History Reviewed:  Reviewed past medical,surgical, social and family history.  Reviewed problem list, medications and allergies. Physical Assessment:   Vitals:   10/12/22 1031  BP: 106/70  Pulse: 97  Weight: 242 lb (109.8 kg)  Height: '5\' 10"'$  (1.778 m)  Body mass index is 34.72 kg/m.        Physical Examination:   General appearance - well appearing, and in no distress  Mental status - alert, oriented to person, place, and time  Psych:  She has a normal mood and affect  Skin - warm and dry, normal color, 45m slightly raised area in a mole.   Chest - effort normal, all lung fields clear to auscultation bilaterally  Heart - normal rate and regular rhythm  Neck:  midline trachea, no thyromegaly or nodules  Breasts - breasts appear normal, no suspicious masses, no skin or nipple changes or axillary nodes. There is tenderness to the breast tissue  Abdomen - soft, nontender, nondistended, no masses or organomegaly  Pelvic - VULVA: normal appearing vulva with  no masses, tenderness or lesions  VAGINA: normal appearing vagina with normal color and discharge, no lesions  CERVIX: normal appearing cervix without discharge or lesions, no CMT  Thin prep pap is done with HR HPV cotesting  IUD strings seen  UTERUS: uterus is felt to be normal size, shape, consistency and nontender   ADNEXA: No adnexal masses or tenderness noted.  Extremities:  No swelling or varicosities noted  Chaperone present for exam  Assessment & Plan:  1. Well woman exam with routine gynecological exam - Cytology - PAP( [facility])  2. IUD check up Present. No concerns at this point  3. Breast tenderness in female Probably hormone related.   4. Atypical mole Referred to dermatology   Labs/procedures today:   No orders of the defined types were placed in this encounter.   Meds: No orders of the defined types were placed in this encounter.   Follow-up: No follow-ups on file.  JTruett Mainland DO 10/12/2022 10:44 AM

## 2022-10-17 LAB — CYTOLOGY - PAP: Diagnosis: NEGATIVE

## 2022-10-28 ENCOUNTER — Other Ambulatory Visit (HOSPITAL_BASED_OUTPATIENT_CLINIC_OR_DEPARTMENT_OTHER): Payer: Self-pay

## 2022-11-11 ENCOUNTER — Other Ambulatory Visit (HOSPITAL_BASED_OUTPATIENT_CLINIC_OR_DEPARTMENT_OTHER): Payer: Self-pay

## 2022-11-11 MED ORDER — ERYTHROMYCIN 5 MG/GM OP OINT
TOPICAL_OINTMENT | OPHTHALMIC | 0 refills | Status: DC
  Filled 2022-11-11: qty 3.5, 7d supply, fill #0

## 2022-11-22 ENCOUNTER — Other Ambulatory Visit (HOSPITAL_BASED_OUTPATIENT_CLINIC_OR_DEPARTMENT_OTHER): Payer: Self-pay

## 2022-11-22 MED ORDER — KETOCONAZOLE 200 MG PO TABS
400.0000 mg | ORAL_TABLET | ORAL | 0 refills | Status: DC
  Filled 2022-11-22: qty 4, 14d supply, fill #0

## 2022-11-23 ENCOUNTER — Other Ambulatory Visit (HOSPITAL_BASED_OUTPATIENT_CLINIC_OR_DEPARTMENT_OTHER): Payer: Self-pay

## 2022-11-24 ENCOUNTER — Other Ambulatory Visit (HOSPITAL_BASED_OUTPATIENT_CLINIC_OR_DEPARTMENT_OTHER): Payer: Self-pay

## 2022-11-24 MED ORDER — FLUCONAZOLE 150 MG PO TABS
300.0000 mg | ORAL_TABLET | ORAL | 0 refills | Status: DC
  Filled 2022-11-24 – 2022-12-27 (×2): qty 8, 28d supply, fill #0

## 2022-12-05 ENCOUNTER — Other Ambulatory Visit (HOSPITAL_BASED_OUTPATIENT_CLINIC_OR_DEPARTMENT_OTHER): Payer: Self-pay

## 2022-12-27 ENCOUNTER — Other Ambulatory Visit (HOSPITAL_BASED_OUTPATIENT_CLINIC_OR_DEPARTMENT_OTHER): Payer: Self-pay

## 2023-03-16 ENCOUNTER — Other Ambulatory Visit (HOSPITAL_BASED_OUTPATIENT_CLINIC_OR_DEPARTMENT_OTHER): Payer: Self-pay

## 2023-03-16 MED ORDER — PREDNISONE 10 MG PO TABS
ORAL_TABLET | ORAL | 0 refills | Status: DC
  Filled 2023-03-16: qty 48, 12d supply, fill #0

## 2023-03-16 MED ORDER — FEXOFENADINE HCL 60 MG PO TABS: 60.0000 mg | ORAL_TABLET | Freq: Three times a day (TID) | ORAL | 1 refills | Status: DC

## 2023-03-16 MED ORDER — FAMOTIDINE 40 MG PO TABS
40.0000 mg | ORAL_TABLET | Freq: Two times a day (BID) | ORAL | 0 refills | Status: DC
  Filled 2023-03-16: qty 20, 10d supply, fill #0

## 2023-03-17 ENCOUNTER — Other Ambulatory Visit: Payer: Self-pay

## 2023-03-18 ENCOUNTER — Emergency Department (HOSPITAL_BASED_OUTPATIENT_CLINIC_OR_DEPARTMENT_OTHER)
Admission: EM | Admit: 2023-03-18 | Discharge: 2023-03-18 | Disposition: A | Discharge status: Home / Self Care | Payer: Medicaid Other | Attending: Emergency Medicine | Admitting: Emergency Medicine

## 2023-03-18 ENCOUNTER — Other Ambulatory Visit: Payer: Self-pay

## 2023-03-18 ENCOUNTER — Emergency Department (HOSPITAL_BASED_OUTPATIENT_CLINIC_OR_DEPARTMENT_OTHER): Payer: Medicaid Other

## 2023-03-18 ENCOUNTER — Encounter (HOSPITAL_BASED_OUTPATIENT_CLINIC_OR_DEPARTMENT_OTHER): Payer: Self-pay | Admitting: Emergency Medicine

## 2023-03-18 DIAGNOSIS — J029 Acute pharyngitis, unspecified: Secondary | ICD-10-CM | POA: Diagnosis not present

## 2023-03-18 DIAGNOSIS — J351 Hypertrophy of tonsils: Secondary | ICD-10-CM

## 2023-03-18 DIAGNOSIS — Z91010 Allergy to peanuts: Secondary | ICD-10-CM | POA: Insufficient documentation

## 2023-03-18 LAB — PREGNANCY, URINE: Preg Test, Ur: NEGATIVE

## 2023-03-18 LAB — RESPIRATORY PANEL BY PCR

## 2023-03-18 LAB — CBC WITH DIFFERENTIAL/PLATELET
Abs Immature Granulocytes: 0.06 10*3/uL (ref 0.00–0.07)
Basophils Absolute: 0.1 10*3/uL (ref 0.0–0.1)
Basophils Relative: 0 %
Eosinophils Absolute: 0 10*3/uL (ref 0.0–0.5)
Eosinophils Relative: 0 %
HCT: 42.5 % (ref 36.0–46.0)
Hemoglobin: 14.5 g/dL (ref 12.0–15.0)
Immature Granulocytes: 1 %
Lymphocytes Relative: 29 %
Lymphs Abs: 3.5 10*3/uL (ref 0.7–4.0)
MCH: 31.5 pg (ref 26.0–34.0)
MCHC: 34.1 g/dL (ref 30.0–36.0)
MCV: 92.2 fL (ref 80.0–100.0)
Monocytes Absolute: 0.9 10*3/uL (ref 0.1–1.0)
Monocytes Relative: 7 %
Neutro Abs: 7.5 10*3/uL (ref 1.7–7.7)
Neutrophils Relative %: 63 %
Platelets: 255 10*3/uL (ref 150–400)
RBC: 4.61 MIL/uL (ref 3.87–5.11)
RDW: 12.3 % (ref 11.5–15.5)
WBC: 12 10*3/uL — ABNORMAL HIGH (ref 4.0–10.5)
nRBC: 0 % (ref 0.0–0.2)

## 2023-03-18 LAB — COMPREHENSIVE METABOLIC PANEL
ALT: 23 U/L (ref 0–44)
AST: 22 U/L (ref 15–41)
Albumin: 4.4 g/dL (ref 3.5–5.0)
Alkaline Phosphatase: 62 U/L (ref 38–126)
Anion gap: 9 (ref 5–15)
BUN: 21 mg/dL — ABNORMAL HIGH (ref 6–20)
CO2: 23 mmol/L (ref 22–32)
Calcium: 8.8 mg/dL — ABNORMAL LOW (ref 8.9–10.3)
Chloride: 108 mmol/L (ref 98–111)
Creatinine, Ser: 0.86 mg/dL (ref 0.44–1.00)
GFR, Estimated: >60 mL/min (ref >60)
Glucose, Bld: 103 mg/dL — ABNORMAL HIGH (ref 70–99)
Potassium: 3.4 mmol/L — ABNORMAL LOW (ref 3.5–5.1)
Sodium: 140 mmol/L (ref 135–145)
Total Bilirubin: 0.5 mg/dL (ref 0.3–1.2)
Total Protein: 7.3 g/dL (ref 6.5–8.1)

## 2023-03-18 LAB — MONONUCLEOSIS SCREEN: Mono Screen: NEGATIVE

## 2023-03-18 MED ORDER — DEXAMETHASONE SODIUM PHOSPHATE 10 MG/ML IJ SOLN
10.0000 mg | Freq: Once | INTRAMUSCULAR | Status: AC
  Administered 2023-03-18: 10 mg via INTRAVENOUS
  Filled 2023-03-18: qty 1

## 2023-03-18 MED ORDER — SODIUM CHLORIDE 0.9 % IV BOLUS
1000.0000 mL | Freq: Once | INTRAVENOUS | Status: AC
  Administered 2023-03-18: 1000 mL via INTRAVENOUS

## 2023-03-18 MED ORDER — IOHEXOL 300 MG/ML  SOLN
75.0000 mL | Freq: Once | INTRAMUSCULAR | Status: AC | PRN
  Administered 2023-03-18: 75 mL via INTRAVENOUS

## 2023-03-18 MED ORDER — CLINDAMYCIN HCL 150 MG PO CAPS
300.0000 mg | ORAL_CAPSULE | Freq: Once | ORAL | Status: AC
  Administered 2023-03-18: 300 mg via ORAL
  Filled 2023-03-18: qty 2

## 2023-03-18 MED ORDER — CLINDAMYCIN HCL 300 MG PO CAPS: 300.0000 mg | ORAL_CAPSULE | Freq: Three times a day (TID) | ORAL | 0 refills | Status: DC

## 2023-03-18 NOTE — ED Provider Notes (Signed)
[place] [facility] Provider Note   CSN: [id number] Arrival date & time: 03/18/23  1452     History  Chief Complaint  Patient presents with   Sore Throat    Emily Villanueva is a 27 y.o. female here presenting with sore throat and neck swelling.  Patient states that 3 days ago, she drink some almond milk and was thought to have some allergic reaction.  She took some Benadryl and did not help so went to urgent care.  She received steroids at urgent care.  She states that since then she is still noticed that her neck swelling has gotten worse.  Patient had a negative strep swab at urgent care 3 days ago.  Patient states that she has subjective feeling of unable to swallow.  She denies any shortness of breath.  Patient also lost her voice today.  Patient went to urgent care was sent here for further evaluation  The history is provided by the patient.       Home Medications Prior to Admission medications   Medication Sig Start Date End Date Taking? Authorizing Provider  dicyclomine (BENTYL) 20 MG tablet Take 1 tablet (20 mg total) by mouth 3 (three) times daily as needed for spasms. Patient not taking: Reported on 10/12/2022 08/17/22   Long, Arlyss Repress, MD  erythromycin ophthalmic ointment Apply 1 a small amount on eyelid three times a day Use a small amount and apply into lower cul de sac of right lower lid three times a day. 11/11/22     famotidine (PEPCID) 40 MG tablet Take 1 tablet (40 mg total) by mouth 2 (two) times daily for 10 days. 03/16/23 03/26/23    fexofenadine (ALLEGRA) 60 MG tablet Take 1 tablet (60 mg total) by mouth 3 (three) times daily. 03/16/23     fluconazole (DIFLUCAN) 150 MG tablet Take 2 tablets (300 mg total) by mouth once a week. 11/24/22     ketoconazole (NIZORAL) 200 MG tablet Take 2 tablets (400 mg total) by mouth once a week. Work up a sweat one hour later. Do not wash off. Repeat one week later. 11/22/22     levonorgestrel (MIRENA) 20  MCG/24HR IUD 1 each by Intrauterine route once.    [provider]  predniSONE (DELTASONE) 10 MG tablet Day 1-4: Take 2 tablets by mouth before breakfast, 1 tablet after lunch, 1 tablet after supper, and 2 tablets at bedtime. Day 5-8: Take 1 tablet by mouth before breakfast. 1 tablet after lunch, 1 tablet after supper, and 1 tablet at bedtime. Day 9-12: Take 1 tablet by mouth before breakfast, and 1 tablet at bedtime. 03/16/23         Allergies    Gluten meal, Lactose intolerance (gi), Peanuts [peanut oil], Shrimp [shellfish allergy], and Wheat    Review of Systems   Review of Systems  HENT:  Positive for sore throat and trouble swallowing.   All other systems reviewed and are negative.   Physical Exam Updated Vital Signs BP (!) 116/99 (BP Location: Right Arm)   Pulse (!) 114   Temp 98.5 F (36.9 C) (Oral)   Resp 16   Ht 5\' 10"  (1.778 m)   Wt 104.3 kg   SpO2 100%   BMI 33.00 kg/m  Physical Exam Vitals and nursing note reviewed.  Constitutional:      Comments: Well-appearing  HENT:     Head: Normocephalic.     Right Ear: Tympanic membrane normal.  Mouth/Throat:     Tonsils: No tonsillar exudate.     Comments: No obvious tonsil exudates Eyes:     Conjunctiva/sclera: Conjunctivae normal.  Neck:     Comments: Patient has enlarged bilateral submandibular lymph nodes. Cardiovascular:     Rate and Rhythm: Normal rate and regular rhythm.  Pulmonary:     Effort: Pulmonary effort is normal.     Breath sounds: Normal breath sounds.  Abdominal:     General: Bowel sounds are normal.     Palpations: Abdomen is soft.  Musculoskeletal:     Cervical back: Normal range of motion.  Skin:    General: Skin is warm.  Neurological:     General: No focal deficit present.     Mental Status: She is oriented to person, place, and time.  Psychiatric:        Mood and Affect: Mood normal.     ED Results / Procedures / Treatments   Labs (all labs ordered are listed, but  only abnormal results are displayed) Labs Reviewed  RESPIRATORY PANEL BY PCR  CBC WITH DIFFERENTIAL/PLATELET  COMPREHENSIVE METABOLIC PANEL  PREGNANCY, URINE  MONONUCLEOSIS SCREEN    EKG None  Radiology No results found.  Procedures Procedures    Medications Ordered in ED Medications  sodium chloride 0.9 % bolus 1,000 mL (has no administration in time range)  dexamethasone (DECADRON) injection 10 mg (has no administration in time range)    ED Course/ Medical Decision Making/ A&P                             Medical Decision Making Emily Villanueva is a 27 y.o. female here presenting with trouble swallowing and neck swelling.  I think likely laryngitis and less likely allergic reaction.  Will swab for respiratory viral panel.  Will also get CT neck and give Decadron  5:57 PM Reviewed patient's labs and white blood cell count is 12.  Monospot is negative.  Patient does have swollen Palatine tonsils concerning for possible acute pharyngitis.  Given leukocytosis and presence of pharyngitis on CT, I think she may benefit from a course of antibiotics.  Will give clindamycin.  Problems Addressed: Palatine tonsil hypertrophy: acute illness or injury Pharyngitis, unspecified etiology: acute illness or injury  Amount and/or Complexity of Data Reviewed Labs: ordered. Radiology: ordered.  Risk Prescription drug management.  Final Clinical Impression(s) / ED Diagnoses Final diagnoses:  None    Rx / DC Orders ED Discharge Orders     None         Charlynne Pander, MD 03/18/23 1758

## 2023-03-18 NOTE — ED Notes (Signed)
Pt transported to CT ?

## 2023-03-18 NOTE — ED Triage Notes (Signed)
Pt reports that on Wednesday she ate a nut she was allergic to. Went to an urgent care on Thursday and was given sterid and benedryl injection. Reports throat has been sore since then. Reports voice is hoarse today and neck feels more swollen and very scratchy. Says L side of neck feels worse. Pt reports no trouble swallowing. No wheezing noted.

## 2023-03-18 NOTE — Discharge Instructions (Signed)
As we discussed, your tonsils are enlarged on your CT scan which is consistent with tonsil infection  I have prescribed clindamycin 300 mg 3 times a day for a week  See your doctor for follow-up  Return to ER if you have worse trouble swallowing, throat closing, fever

## 2023-03-18 NOTE — ED Notes (Signed)
ED Provider at bedside. 

## 2023-03-20 ENCOUNTER — Other Ambulatory Visit (HOSPITAL_BASED_OUTPATIENT_CLINIC_OR_DEPARTMENT_OTHER): Payer: Self-pay

## 2023-05-09 NOTE — Progress Notes (Unsigned)
NEW PATIENT Date of Service/Encounter:  05/10/23 Referring provider: Erskine Emery, NP Primary care provider: [facility]  Subjective:  Emily Villanueva is a 27 y.o. female  presenting today for evaluation of chronic rhinitis and adverse food reactionchronic rhinitis and adverse food reaction. History obtained from: chart review and patient.   Chronic rhinitis: started in childhood Symptoms include: nasal congestion, rhinorrhea, watery eyes, and itchy eyes,post nasal drip, sneezing attacks Occurs year-round Potential triggers: pollen Treatments tried: allergy eye drops OTC ([employer] brand equate allergy relief), has not tried pataday, claritin D-24 hours - taking this once a day for many years, off now for a few months.  Previous allergy testing: yes-was told it was negative but was on AH History of reflux/heartburn:  no often Previous sinus, ear, tonsil, adenoid surgeries: tonsillectomy  Concern for Food Allergy:  History of reaction:  Kiwi: feels like acid swishing in her mouth Dairy: extreme abdominal pain, but tolerates lactose free dairy. Almond: throat itchy/scratchy, hoarseness, told her throat was closing at an UC Peanuts: ate reese's cup years ago and felt like her throat was closing. Shellfish: rash, no other symptoms except maybe abdominal pain. Avoids fish due to reaction with shellfish. She does report a recent exposure to tree nuts in an ice cream and developed immediate throat and mouth itching. Resolved with benadryl PRN. This was a few weeks ago. Previous testing when on AH negative but in blood test. Carries an epinephrine autoinjector: no  Chart Review:  Reviewed previous allergy appointment with Dr. Elijah Villanueva ([facility]): seen 2021 for concern for food allergies-reported upset stomach and rashes, food intolerance suspected and labs ordered  05/12/20: sIgE: positive to walnut 0.74, pistachio 0.82, pecan 0.11, almonds 0.81, [place] nut 0.39, hazelnut  0.69, peanut 0.80, cashew < 0.10 Shellfish clams 0.36, crab 0.45, lobster 0.26, oyster 0.29 shrimp 0.20, scallop Patient told testing was negative.   Other allergy screening: Asthma: no Medication allergy: no Hymenoptera allergy: no Eczema:no History of recurrent infections suggestive of immunodeficency: no Vaccinations are up to date from childhood.  Past Medical History: Past Medical History:  Diagnosis Date   Anxiety and depression    Depression, major, recurrent (HCC) 10/19/2015   Started on Zoloft 10/19/2015    Family history of movement disorder 09/21/2015   Patient's mother and maternal uncle have progressive movement disorder    Scoliosis    Medication List:  Current Outpatient Medications  Medication Sig Dispense Refill   Azelastine-Fluticasone (DYMISTA) 137-50 MCG/ACT SUSP Place 1 spray into both nostrils 2 (two) times daily as needed. 23 g 5   cetirizine (ZYRTEC ALLERGY) 10 MG tablet Take 1 tablet (10 mg total) by mouth daily. 32 tablet 5   EPINEPHrine (EPIPEN 2-PAK) 0.3 mg/0.3 mL IJ SOAJ injection Inject 0.3 mg into the muscle as needed for anaphylaxis. 2 each 2   levonorgestrel (MIRENA) 20 MCG/24HR IUD 1 each by Intrauterine route once.     Olopatadine HCl (PATADAY) 0.2 % SOLN Place 1 drop into both eyes daily as needed. 2.5 mL 5   Current Facility-Administered Medications  Medication Dose Route Frequency Provider Last Rate Last Admin   cetirizine (ZYRTEC) tablet 10 mg  10 mg Oral Daily Emily Monte, MD   10 mg at 05/10/23 1514   Known Allergies:  Allergies  Allergen Reactions   Gluten Meal Hives   Lactose Intolerance (Gi) Hives   Peanuts [Peanut Oil] Hives   Shrimp [Shellfish Allergy] Hives   Wheat Hives   Past Surgical History: Past Surgical  History:  Procedure Laterality Date   TONSILLECTOMY     WISDOM TOOTH EXTRACTION     Family History: Family History  Problem Relation Age of Onset   Stroke Mother    Arrhythmia Father    Arrhythmia Paternal  Grandfather    Social History: [name] lives in a townhouse felt 7 years ago, no water damage, tile and concrete floors, electric heating, central cooling, 2 [place] shepherd's indoors, no roaches, not using just make protection of the bedding or pillows, she has a previous smoking history of 1 to 2 pack/day x 16.5 years, exposed to chemicals and dust at work, [place] does [place] with [place]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   ROS:  All other systems negative except as noted per HPI.  Objective:  Blood pressure 90/60, pulse (!) 116, temperature 97.7 F (36.5 C), temperature source Temporal, resp. rate 16, height 5' 9.69" (1.77 m), weight 235 lb 3.2 oz (106.7 kg), SpO2 96%, currently breastfeeding. Body mass index is 34.05 kg/m. Physical Exam:  General Appearance:  Alert, cooperative, no distress, appears stated age  Head:  Normocephalic, without obvious abnormality, atraumatic  Eyes:  Conjunctiva clear, EOM's intact  Ears EACs normal bilaterally and normal TMs bilaterally  Nose: Nares normal, hypertrophic turbinates, normal mucosa, and no visible anterior polyps  Throat: Lips, tongue normal; teeth and gums normal, normal posterior oropharynx  Neck: Supple, symmetrical  Lungs:   clear to auscultation bilaterally, Respirations unlabored, no coughing  Heart:  regular rate and rhythm and no murmur, Appears well perfused  Extremities: No edema  Skin: Skin color, texture, turgor normal and no rashes or lesions on visualized portions of skin  Neurologic: No gross deficits   Diagnostics: Skin Testing: Environmental allergy panel and select foods. Adequate positive and negative controls. Results discussed with patient/family.  Airborne Adult Perc - 05/10/23 1459     Time Antigen Placed 0345    Allergen Manufacturer Waynette Buttery    Location Back    Number of Test 55    1. Control-Buffer 50% Glycerol Negative    2. Control-Histamine 3+    3. [place]ia 3+    4. [place] 3+    5. Johnson 3+    6. [place] Blue 3+    7.  Meadow Fescue 4+    8. Perennial Rye 4+    9. Timothy 4+    10. Ragweed Mix 2+    11. Cocklebur Negative    12. Plantain,  English 3+    13. Baccharis Negative    14. Dog Fennel Negative    15. [place] Thistle 2+    16. Lamb's Quarters 2+    17. Sheep Sorrell 2+    18. Rough Pigweed 2+    19. Marsh Elder, Rough 2+    20. Mugwort, Common Negative    21. Box, Elder Negative    22. Cedar, red Negative    23. Sweet Gum Negative    24. Pecan Pollen 4+    25. Pine Mix Negative    26. Walnut, Black Pollen Negative    27. Red Mulberry 3+    28. Ash Mix Negative    29. Birch Mix 3+    30. Beech American Negative    31. Cottonwood, Eastern 3+    32. Hickory, White 4+    33. Maple Mix 2+    34. Oak, [place] Mix 3+    35. Sycamore Eastern Negative    36. Alternaria Alternata Negative    37. Cladosporium Herbarum Negative  38. Aspergillus Mix Negative    39. Penicillium Mix 2+    40. Bipolaris Sorokiniana (Helminthosporium) Negative    41. Drechslera Spicifera (Curvularia) Negative    42. Mucor Plumbeus Negative    43. Fusarium Moniliforme Negative    44. Aureobasidium Pullulans (pullulara) Negative    45. Rhizopus Oryzae Negative    46. Botrytis Cinera Negative    47. Epicoccum Nigrum Negative    48. Phoma Betae Negative    49. Dust Mite Mix Negative    50. Cat Hair 10,000 BAU/ml Negative    51.  Dog Epithelia Negative    52. Mixed Feathers Negative    53. Horse Epithelia Negative    54. Cockroach, German Negative    55. Tobacco Leaf Negative             Intradermal - 05/10/23 1541     Time Antigen Placed 0330    Allergen Manufacturer Waynette Buttery    Location Arm    Number of Test 8    Intradermal Select    Control Negative    [place] Omitted    [place] Omitted    Johnson Omitted    7 Grass Omitted    Ragweed Mix Omitted    Weed Mix Omitted    Tree Mix Omitted    Mold 1 Negative    Mold 2 Omitted    Mold 3 Negative    Mold 4 Negative    Mite Mix Negative    Cat  2+    Dog 3+    Cockroach 2+    Other Omitted             Food Adult Perc - 05/10/23 1400     Time Antigen Placed 0245    Allergen Manufacturer Greer    Location Back    Number of allergen test 23    1. Peanut --   7x20 wheal   5. Milk, Cow Negative    8. Shellfish Mix Negative    9. Fish Mix Negative    10. Cashew Negative    11. Walnut Food Negative    12. Almond Negative    13. Hazelnut Negative    14. Pecan Food --   6x17 wheal   15. Pistachio --   5x10 wheal   16. [place] Nut Negative    17. Coconut Negative    18. Trout Negative    19. Tuna Negative    20. Salmon Negative    21. Flounder Negative    22. Codfish Negative    23. Shrimp Negative    24. Crab Negative    25. Lobster Negative    26. Oyster --   7x20 wheal   27. Scallops --   7x30 wheal   57. Banana Negative             Allergy testing results were read and interpreted by myself, documented by clinical staff.  Labs:  Lab Orders         IgE Nut Prof. w/Component Rflx         Allergen Profile, Food-Fish         Allergen Profile, Shellfish         Allergen, Kiwi Fruit, f84     Assessment and Plan   Chronic Rhinitis: determined to be Seasonal and Perennial Allergic: - allergy testing today: positive to grass, weed and tree pollen, indoor mold (penicillium-not penicillin; okay to take penicillins) - intradermals positive to cat, dog and cockroach - Prevention:  -  allergen avoidance when possible - consider allergy shots as long term control of your symptoms by teaching your immune system to be more tolerant of your allergy triggers - Symptom control: - Start Dymista 1 spray in each nostril twice daily as needed - Start Antihistamine: daily or daily as needed.   -Options include Zyrtec (Cetirizine) 10mg , Claritin (Loratadine) 10mg , Allegra (Fexofenadine) 180mg , or Xyzal (Levocetirinze) 5mg  - Can be purchased over-the-counter if not covered by insurance. Do not use Claritin-D (or any other  medications with D) as this is a decongestant, can worsen your symptoms over time and can cause increased blood pressure.  Allergic Conjunctivitis:  - Start Allergy Eye drops-great options include Pataday (Olopatadine) or Zaditor (ketotifen) for eye symptoms daily as needed-both sold over the counter if not covered by insurance.  -Avoid eye drops that say red eye relief as they may contain medications that dry out your eyes.  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 If OAS, may consider challenge to nut butters if does AIT.  More concerning history for type I reaction to shellfish - today's skin testing was positive to peanut, scallops, oyster, pecan, and pistachio - labs today peanuts, tree nuts, fish, shellfish, kiwi - please strictly avoid peanuts, tree nuts, fish, shellfish, kiwi - for SKIN only reaction, okay to take Benadryl 2 capsules every 4 hours - for SKIN + ANY additional symptoms, OR IF concern for LIFE THREATENING reaction = Epipen Autoinjector EpiPen 0.3 mg. - If using Epinephrine autoinjector, call 911 - A food allergy action plan has been provided and discussed. - Medic Alert identification is recommended.  Lactose Intolerance- Milk allergy testing today is negative - this is not an allergy but rather a problem with breaking down dairy (due to an enzyme deficiency) which can lead to bloating, gas, diarrhea, nausea and vomiting - choose lactose free dairy or take lactaid prior to eating dairy products that contain lactose   Follow up : 3 months, sooner if needed It was a pleasure meeting you in clinic today! Thank you for allowing me to participate in your care.  This note in its entirety was forwarded to the Provider who requested this consultation.  Other: samples provided of: pataday  Thank you for your kind referral. I appreciate the  opportunity to take part in Quana's care. Please do not hesitate to contact me with questions.  Sincerely,  Tonny Bollman, MD [facility]

## 2023-05-10 ENCOUNTER — Other Ambulatory Visit (HOSPITAL_BASED_OUTPATIENT_CLINIC_OR_DEPARTMENT_OTHER): Payer: Self-pay

## 2023-05-10 ENCOUNTER — Ambulatory Visit (INDEPENDENT_AMBULATORY_CARE_PROVIDER_SITE_OTHER): Payer: Medicaid Other | Admitting: Internal Medicine

## 2023-05-10 ENCOUNTER — Encounter: Payer: Self-pay | Admitting: Internal Medicine

## 2023-05-10 VITALS — BP 90/60 | HR 116 | Temp 97.7°F | Resp 16 | Ht 69.69 in | Wt 235.2 lb

## 2023-05-10 DIAGNOSIS — H1013 Acute atopic conjunctivitis, bilateral: Secondary | ICD-10-CM

## 2023-05-10 DIAGNOSIS — E739 Lactose intolerance, unspecified: Secondary | ICD-10-CM

## 2023-05-10 DIAGNOSIS — J3089 Other allergic rhinitis: Secondary | ICD-10-CM | POA: Diagnosis not present

## 2023-05-10 DIAGNOSIS — J302 Other seasonal allergic rhinitis: Secondary | ICD-10-CM | POA: Insufficient documentation

## 2023-05-10 DIAGNOSIS — T781XXA Other adverse food reactions, not elsewhere classified, initial encounter: Secondary | ICD-10-CM

## 2023-05-10 MED ORDER — AZELASTINE-FLUTICASONE 137-50 MCG/ACT NA SUSP
1.0000 | Freq: Two times a day (BID) | NASAL | 5 refills | Status: DC | PRN
  Filled 2023-05-10: qty 23, 30d supply, fill #0

## 2023-05-10 MED ORDER — CETIRIZINE HCL 10 MG PO TABS
10.0000 mg | ORAL_TABLET | Freq: Every day | ORAL | 5 refills | Status: DC
  Filled 2023-05-10 (×2): qty 32, 32d supply, fill #0
  Filled 2023-06-06: qty 32, 32d supply, fill #1
  Filled 2023-07-11: qty 32, 32d supply, fill #2
  Filled 2023-08-25: qty 32, 32d supply, fill #3
  Filled 2023-09-21: qty 32, 32d supply, fill #4

## 2023-05-10 MED ORDER — EPINEPHRINE 0.3 MG/0.3ML IJ SOAJ
0.3000 mg | INTRAMUSCULAR | 2 refills | Status: DC | PRN
  Filled 2023-05-10 (×2): qty 2, 1d supply, fill #0

## 2023-05-10 MED ORDER — OLOPATADINE HCL 0.2 % OP SOLN
1.0000 [drp] | Freq: Every day | OPHTHALMIC | 5 refills | Status: DC | PRN
  Filled 2023-05-10: qty 2.5, 30d supply, fill #0

## 2023-05-10 MED ORDER — CETIRIZINE HCL 10 MG PO TABS
10.0000 mg | ORAL_TABLET | Freq: Every day | ORAL | Status: DC
  Administered 2023-05-10: 10 mg via ORAL

## 2023-05-10 NOTE — Patient Instructions (Signed)
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   -Options include Zyrtec (Cetirizine) 10mg , Claritin (Loratadine) 10mg , Allegra (Fexofenadine) 180mg , or Xyzal (Levocetirinze) 5mg  - Can be purchased over-the-counter if not covered by insurance. Do not use Claritin-D (or any other medications with D) as this is a decongestant, can worsen your symptoms over time and can cause increased blood pressure.  Allergic Conjunctivitis:  - Start Allergy Eye drops-great options include Pataday (Olopatadine) or Zaditor (ketotifen) for eye symptoms daily as needed-both sold over the counter if not covered by insurance.  -Avoid eye drops that say red eye relief as they may contain medications that dry out your eyes.  Food allergy:  - today's skin testing was positive to peanut, scallops, oyster, pecan, and pistachio - labs today peanuts, tree nuts, fish, shellfish, kiwi - please strictly avoid peanuts, tree nuts, fish, shellfish, kiwi - for SKIN only reaction, okay to take Benadryl 2 capsules every 4 hours - for SKIN + ANY additional symptoms, OR IF concern for LIFE THREATENING reaction = Epipen Autoinjector EpiPen 0.3 mg. - If using Epinephrine autoinjector, call 911 - A food allergy action plan has been provided and discussed. - Medic Alert identification is recommended.  Lactose Intolerance- Milk allergy testing today is negative - this is not an allergy but rather a problem with breaking  down dairy (due to an enzyme deficiency) which can lead to bloating, gas, diarrhea, nausea and vomiting - choose lactose free dairy or take lactaid prior to eating dairy products that contain lactose   Follow up : 3 months, sooner if needed It was a pleasure meeting you in clinic today! Thank you for allowing me to participate in your care.  Tonny Bollman, MD [facility]  Reducing Pollen Exposure  The American Academy of Allergy, Asthma and Immunology suggests the following steps to reduce your exposure to pollen during allergy seasons.    Do not hang sheets or clothing out to dry; pollen may collect on these items. Do not mow lawns or spend time around freshly cut grass; mowing stirs up pollen. Keep windows closed at night.  Keep car windows closed while driving. Minimize morning activities outdoors, a time when pollen counts are usually at their highest. Stay indoors as much as possible when pollen counts or humidity is high and on windy days when pollen tends to remain in the air longer. Use air conditioning when possible.  Many air conditioners have filters that trap the pollen spores. Use a HEPA room air filter to remove pollen form the indoor air you breathe. Control of Mold Allergen   Mold and fungi can grow on a variety of surfaces provided certain temperature and moisture conditions exist.  Outdoor molds grow on plants, decaying vegetation and soil.  The major outdoor mold, Alternaria and Cladosporium, are found in very high numbers during hot and dry conditions.  Generally, a late Summer - Fall peak is seen for common outdoor fungal spores.  Rain will temporarily lower outdoor mold spore  count, but counts rise rapidly when the rainy period ends.  The most important indoor molds are Aspergillus and Penicillium.  Dark, humid and poorly ventilated basements are ideal sites for mold growth.  The next most common sites of mold growth are the bathroom and the  kitchen.  Outdoor (Seasonal) Mold Control  Use air conditioning and keep windows closed Avoid exposure to decaying vegetation. Avoid leaf raking. Avoid grain handling. Consider wearing a face mask if working in moldy areas.    Indoor (Perennial) Mold Control   Maintain humidity below 50%. Clean washable surfaces with 5% bleach solution. Remove sources e.g. contaminated carpets.  Control of Dog or Cat Allergen  Avoidance is the best way to manage a dog or cat allergy. If you have a dog or cat and are allergic to dog or cats, consider removing the dog or cat from the home. If you have a dog or cat but don't want to find it a new home, or if your family wants a pet even though someone in the household is allergic, here are some strategies that may help keep symptoms at bay:  Keep the pet out of your bedroom and restrict it to only a few rooms. Be advised that keeping the dog or cat in only one room will not limit the allergens to that room. Don't pet, hug or kiss the dog or cat; if you do, wash your hands with soap and water. High-efficiency particulate air (HEPA) cleaners run continuously in a bedroom or living room can reduce allergen levels over time. Regular use of a high-efficiency vacuum cleaner or a central vacuum can reduce allergen levels. Giving your dog or cat a bath at least once a week can reduce airborne allergen. Control of Cockroach Allergen  Cockroach allergen has been identified as an important cause of acute attacks of asthma, especially in urban settings.  There are fifty-five species of cockroach that exist in the [place], however only three, the [place], [place] species produce allergen that can affect patients with Asthma.  Allergens can be obtained from fecal particles, egg casings and secretions from cockroaches.    Remove food sources. Reduce access to water. Seal access and entry points. Spray runways with 0.5-1% Diazinon or  Chlorpyrifos Blow boric acid power under stoves and refrigerator. Place bait stations (hydramethylnon) at feeding sites.

## 2023-05-11 ENCOUNTER — Other Ambulatory Visit: Payer: Self-pay

## 2023-05-11 ENCOUNTER — Other Ambulatory Visit (HOSPITAL_BASED_OUTPATIENT_CLINIC_OR_DEPARTMENT_OTHER): Payer: Self-pay

## 2023-05-12 ENCOUNTER — Telehealth: Payer: Self-pay

## 2023-05-12 ENCOUNTER — Other Ambulatory Visit (HOSPITAL_COMMUNITY): Payer: Self-pay

## 2023-05-12 ENCOUNTER — Other Ambulatory Visit (HOSPITAL_BASED_OUTPATIENT_CLINIC_OR_DEPARTMENT_OTHER): Payer: Self-pay

## 2023-05-12 MED ORDER — DYMISTA 137-50 MCG/ACT NA SUSP: 1.0000 | Freq: Two times a day (BID) | NASAL | 5 refills | Status: DC | PRN

## 2023-05-12 NOTE — Telephone Encounter (Signed)
Pharmacy Patient Advocate Encounter   Received notification from CoverMyMeds that prior authorization for Azelastine-Fluticasone 137-50MCG/ACT suspension is required/requested.   Insurance verification completed.   The patient is insured through  [employer]  .   Per test claim:  Emily Villanueva has a co-pay of $4.00 and is preferred by the ins.  If suggested medication is appropriate, Please send in a new RX and discontinue this one. If not, please advise as to why it's not appropriate so that we may request a Prior Authorization.

## 2023-05-12 NOTE — Telephone Encounter (Signed)
Pharmacy notified to dispense brand dymista

## 2023-05-17 ENCOUNTER — Other Ambulatory Visit (HOSPITAL_BASED_OUTPATIENT_CLINIC_OR_DEPARTMENT_OTHER): Payer: Self-pay

## 2023-05-17 ENCOUNTER — Telehealth: Payer: Self-pay | Admitting: Internal Medicine

## 2023-05-17 MED ORDER — DYMISTA 137-50 MCG/ACT NA SUSP
1.0000 | Freq: Two times a day (BID) | NASAL | 5 refills | Status: DC | PRN
  Filled 2023-05-17 – 2023-05-24 (×2): qty 23, 30d supply, fill #0

## 2023-05-17 NOTE — Telephone Encounter (Signed)
Patient would like a call back to confirm medication information. She states her phamacy is [facility] on [address]. A good call back number [phone].

## 2023-05-17 NOTE — Telephone Encounter (Signed)
Pt neded dymista sent to [facility] instead of [facility] so sent dymista to correct pharmacy

## 2023-05-22 ENCOUNTER — Telehealth: Payer: Self-pay | Admitting: Internal Medicine

## 2023-05-22 NOTE — Telephone Encounter (Signed)
Left a voicemail for a return call. Per lab results:   Verlee Monte, MD 05/16/2023 12:20 PM EDT     Please let Emily Villanueva know that her nut panel including tree nuts and peanuts was low level positive. However, with her history advise strict avoidance and carry an epipen. Shellfish is also low level positive, we could consider a challenge if she has interest in eating. Her fish is completely negative so she is welcome to eat this if she would like. Kiwi is positive-based on history-advise strict avoidance. Let me know if she has any questions.

## 2023-05-22 NOTE — Telephone Encounter (Signed)
Patient called back to go over labs.   Best contact number: [phone]

## 2023-05-23 NOTE — Telephone Encounter (Signed)
Pt returned my call and results given. Emily Villanueva in billing spoke with patient about coverage for food challenge and she will be calling her insurance before she schedules anything.

## 2023-05-24 ENCOUNTER — Other Ambulatory Visit (HOSPITAL_BASED_OUTPATIENT_CLINIC_OR_DEPARTMENT_OTHER): Payer: Self-pay

## 2023-05-31 ENCOUNTER — Ambulatory Visit (INDEPENDENT_AMBULATORY_CARE_PROVIDER_SITE_OTHER): Payer: Medicaid Other | Admitting: Internal Medicine

## 2023-05-31 ENCOUNTER — Encounter: Payer: Self-pay | Admitting: Internal Medicine

## 2023-05-31 VITALS — BP 108/72 | HR 98 | Temp 97.7°F | Resp 18 | Wt 233.8 lb

## 2023-05-31 DIAGNOSIS — L500 Allergic urticaria: Secondary | ICD-10-CM

## 2023-05-31 MED ORDER — METHYLPREDNISOLONE ACETATE 40 MG/ML IJ SUSP
40.0000 mg | Freq: Once | INTRAMUSCULAR | Status: AC
  Administered 2023-05-31: 40 mg via INTRAMUSCULAR

## 2023-05-31 NOTE — Progress Notes (Signed)
Follow Up Note  RE: Emily Villanueva MRN: [id number] DOB: Feb 24, 1996 Date of Office Visit: 05/31/2023  Referring provider: [facility], [facility] Primary care provider: [facility], [facility]  Chief Complaint: Follow-up and Urticaria (Was doing a deep cleaning in her house and after she started itching that night, the cetrizine, hydrocortisone is not doing nothing. She states that's she's miserable.)  History of Present Illness: I had the pleasure of seeing Emily Villanueva for a follow up visit at the [facility] on 06/07/2023. She is a 27 y.o. female, who is being followed for allergic rhinoconjunctivitis, food allergy, lactose intolerance, . Her previous allergy office visit was on 05/10/23 with Dr. Maurine Minister. Today is a  acute visit for urticaria flare  .  History obtained from patient, chart review.  She was deep cleaning her house and was exposed to a large amount of dust.  A few hours later she developed diffuse pruritus which quickly progressed to urticaria on her legs.  She tried treating with cetirizine 10mg  and hydrocortisone OTC cream without good response.  Called today for an appointment due to severity of itching.  She also feels like pataday eye drops are make her eyes itch worse   Assessment and Plan: Emily Villanueva is a 27 y.o. female with: Allergic urticaria   Plan: Patient Instructions  Dermatitis:  contact dermatitis vs urticaria  Depo Medrol 40mg  Im given today  Increase Zyrtec to 10mg  twice daily, if itch uncontrolled can increase to 2 tabs in AM and 2 tabs in PM   -Continue until hive and itch free for 7 days, before decreasing to once daily  Start  Tomorrow: Prednisone 10mg  : Take 2 tablets twice a day for 3 days, Then take 2 tablets once a day for 1 day., then take 1 tablet once a day for 1 day.   Stop allergy eye drops for now   Continue all other medications as prescribed by Dr. Maurine Minister and follow up in 2 month as plan   Thank you so much for letting me  partake in your care today.  Don't hesitate to reach out if you have any additional concerns!  Emily Luz, MD  [facility] [place] [place]    Meds ordered this encounter  Medications   methylPREDNISolone acetate (DEPO-MEDROL) injection 40 mg    Lab Orders  No laboratory test(s) ordered today   Diagnostics: None done   Medication List:  Current Outpatient Medications  Medication Sig Dispense Refill   cetirizine (ZYRTEC ALLERGY) 10 MG tablet Take 1 tablet (10 mg total) by mouth daily. 32 tablet 5   DYMISTA 137-50 MCG/ACT SUSP Place 1 spray into both nostrils 2 (two) times daily as needed. 23 g 5   EPINEPHrine (EPIPEN 2-PAK) 0.3 mg/0.3 mL IJ SOAJ injection Inject 0.3 mg into the muscle as needed for anaphylaxis. 2 each 2   levonorgestrel (MIRENA) 20 MCG/24HR IUD 1 each by Intrauterine route once.     Olopatadine HCl (PATADAY) 0.2 % SOLN Place 1 drop into both eyes daily as needed. 2.5 mL 5   ergocalciferol (VITAMIN D2) 1.25 MG (50000 UT) capsule Take 1 capsule (50,000 Units total) by mouth once a week. 12 capsule 1   Current Facility-Administered Medications  Medication Dose Route Frequency Provider Last Rate Last Admin   cetirizine (ZYRTEC) tablet 10 mg  10 mg Oral Daily Verlee Monte, MD   10 mg at 05/10/23 1514   Allergies: Allergies  Allergen Reactions   Kiwi  Extract    Lactose Intolerance (Gi) Hives   Other     TREE NUTS   Peanuts [Peanut Oil] Hives   Shrimp [Shellfish Allergy] Hives   I reviewed her past medical history, social history, family history, and environmental history and no significant changes have been reported from her previous visit.  ROS: All others negative except as noted per HPI.   Objective: BP 108/72   Pulse 98   Temp 97.7 F (36.5 C) (Temporal)   Resp 18   Wt 233 lb 12.8 oz (106.1 kg)   SpO2 97%   BMI 33.85 kg/m  Body mass index is 33.85 kg/m. General Appearance:  Alert, cooperative, no distress, appears stated age   Head:  Normocephalic, without obvious abnormality, atraumatic  Eyes:  Conjunctiva clear, EOM's intact  Nose: Nares normal,   Throat: Lips, tongue normal; teeth and gums normal,   Neck: Supple, symmetrical  Lungs:   , Respirations unlabored, no coughing  Heart:  , Appears well perfused  Extremities: No edema  Skin: Skin color, texture, turgor normal, "scattered erythematous papules on legs with excoriation marks   Neurologic: No gross deficits   Previous notes and tests were reviewed. The plan was reviewed with the patient/family, and all questions/concerned were addressed.  It was my pleasure to see Emily Villanueva today and participate in her care. Please feel free to contact me with any questions or concerns.  Sincerely,  Emily Luz, MD  Allergy & Immunology  [facility] Office: [phone]

## 2023-05-31 NOTE — Patient Instructions (Addendum)
Dermatitis:  contact dermatitis vs urticaria  Depo Medrol 40mg  Im given today  Increase Zyrtec to 10mg  twice daily, if itch uncontrolled can increase to 2 tabs in AM and 2 tabs in PM   -Continue until hive and itch free for 7 days, before decreasing to once daily  Start  Tomorrow: Prednisone 10mg  : Take 2 tablets twice a day for 3 days, Then take 2 tablets once a day for 1 day., then take 1 tablet once a day for 1 day.   Stop allergy eye drops for now   Continue all other medications as prescribed by Dr. Maurine Minister and follow up in 2 month as plan   Thank you so much for letting me partake in your care today.  Don't hesitate to reach out if you have any additional concerns!  Ferol Luz, MD  [facility] [place] [place]

## 2023-06-01 ENCOUNTER — Other Ambulatory Visit (HOSPITAL_BASED_OUTPATIENT_CLINIC_OR_DEPARTMENT_OTHER): Payer: Self-pay

## 2023-06-01 ENCOUNTER — Telehealth: Payer: Self-pay

## 2023-06-01 ENCOUNTER — Other Ambulatory Visit (HOSPITAL_COMMUNITY): Payer: Self-pay

## 2023-06-01 MED ORDER — PREDNISONE 10 MG PO TABS
ORAL_TABLET | ORAL | 0 refills | Status: AC
  Filled 2023-06-01: qty 15, 5d supply, fill #0

## 2023-06-01 NOTE — Telephone Encounter (Signed)
Patient called and stated she didn't get the prednisone called into the pharmacy, I'm sending in 15 tablets of prednisone 10 mg tablets to the [facility] in [facility].  [place] [phone]

## 2023-06-02 ENCOUNTER — Encounter: Payer: Self-pay | Admitting: Internal Medicine

## 2023-06-02 NOTE — Telephone Encounter (Signed)
Recommend increasing zyrtec to 2 tabs twice daily.

## 2023-06-06 ENCOUNTER — Other Ambulatory Visit (HOSPITAL_BASED_OUTPATIENT_CLINIC_OR_DEPARTMENT_OTHER): Payer: Self-pay

## 2023-06-06 ENCOUNTER — Other Ambulatory Visit: Payer: Self-pay

## 2023-06-06 MED ORDER — ERGOCALCIFEROL 1.25 MG (50000 UT) PO CAPS
50000.0000 [IU] | ORAL_CAPSULE | ORAL | 1 refills | Status: AC
  Filled 2023-06-06: qty 12, 84d supply, fill #0
  Filled 2023-08-25: qty 12, 84d supply, fill #1

## 2023-06-07 ENCOUNTER — Other Ambulatory Visit (HOSPITAL_BASED_OUTPATIENT_CLINIC_OR_DEPARTMENT_OTHER): Payer: Self-pay

## 2023-07-11 ENCOUNTER — Other Ambulatory Visit (HOSPITAL_BASED_OUTPATIENT_CLINIC_OR_DEPARTMENT_OTHER): Payer: Self-pay

## 2023-07-28 ENCOUNTER — Encounter: Payer: Self-pay | Admitting: Internal Medicine

## 2023-07-31 NOTE — Telephone Encounter (Signed)
I would recommend increasing the zyrtec to twice daily again.  If theat doesn't work can go to 20mg  (2 tabs) twice daily.

## 2023-08-08 ENCOUNTER — Other Ambulatory Visit (HOSPITAL_BASED_OUTPATIENT_CLINIC_OR_DEPARTMENT_OTHER): Payer: Self-pay

## 2023-08-08 MED ORDER — ONDANSETRON HCL 4 MG PO TABS
4.0000 mg | ORAL_TABLET | Freq: Three times a day (TID) | ORAL | 0 refills | Status: DC | PRN
  Filled 2023-08-08: qty 20, 7d supply, fill #0

## 2023-08-10 ENCOUNTER — Encounter: Payer: Self-pay | Admitting: Internal Medicine

## 2023-08-10 ENCOUNTER — Ambulatory Visit: Payer: Medicaid Other | Admitting: Internal Medicine

## 2023-08-10 DIAGNOSIS — J3089 Other allergic rhinitis: Secondary | ICD-10-CM

## 2023-08-10 DIAGNOSIS — J302 Other seasonal allergic rhinitis: Secondary | ICD-10-CM

## 2023-08-10 DIAGNOSIS — J069 Acute upper respiratory infection, unspecified: Secondary | ICD-10-CM | POA: Diagnosis not present

## 2023-08-10 NOTE — Progress Notes (Signed)
RE: Emily Villanueva MRN: [id number] DOB: Nov 25, 1995 Date of Telemedicine Visit: 08/10/2023  Referring provider: [facility], [facility] Primary care provider: [facility]  Chief Complaint: Follow-up (Pt states she have been sick since last week. Coughing mucus yellowish, overly thirsty. ), Cough (Tightness when coughing, but getting better.), and Allergic Rhinitis    Telemedicine Follow Up Visit via Telephone: I connected with Emily Villanueva for a follow up on 08/10/23 by telephone and verified that I am speaking with the correct person using two identifiers.   I discussed the limitations, risks, security and privacy concerns of performing an evaluation and management service by telephone and the availability of in person appointments. I also discussed with the patient that there may be a patient responsible charge related to this service. The patient expressed understanding and agreed to proceed.  Patient is at home accompanied by herself who provided/contributed to the history.  Provider is at the office.  Visit start time: 10:32 AM Visit end time: 11:03 AM Insurance consent/check in by: Dion Saucier Medical consent and medical assistant/nurse: Damien Fusi  History of Present Illness: She is a 27 y.o. female, who is being followed for allergic rhinitis, urticaria. Her previous allergy office visit was on 05/31/23 with  Dr. Marlynn Villanueva .   Discussed the use of AI scribe software for clinical note transcription with the patient, who gave verbal consent to proceed.  The patient, with a history of allergies, presented with a significant amount of mucus production, which they attributed to their allergies and a recent illness. They reported that they had been seen at an urgent care center 2 days ago due to current symptoms. They reported that their mucus was starting to change color and return to clear, suggesting that they were on the tail end of their illness, but had misplaced  their nasal saline spray and was not taking Zyrtec due to concerns about this not allowing her to cough up mucus production.  She was instructed by urgent care to start Zyrtec-D, but she is unable to afford that until the 15th of this month.  The patient was previously on Zyrtec and Dymista for their allergies. They were also using nasal saline rinses. They were advised to continue these medications and the rinses, and to consider using Mucinex if their mucus production did not improve. The patient was warned about the potential side effects of Mucinex, including dryness and the need to drink a lot of water while taking it. They were also advised to only use Mucinex for a few days, if needed.  Assessment and Plan: Emily Villanueva is a 27 y.o. female with: Upper Respiratory Illness, improving but with ongoing mucus production:  Currently symptoms improving, do not feel antibiotics warranted at this time. - take zyrtec 10 mg daily - take nasal saline rinse twice daily followed by dymista 1 spray twice daily - if no improvement in 1-2 days, start mucinex 600 mg (1 tablet) every 12 hours to help dry up mucus. Drink with 1-2 glasses of water. - will have samples ready for you for this afternoon of mucinex and saline rinse.   Lab Orders  No laboratory test(s) ordered today    Diagnostics: None.  Medication List:  Current Outpatient Medications  Medication Sig Dispense Refill   cetirizine (ZYRTEC ALLERGY) 10 MG tablet Take 1 tablet (10 mg total) by mouth daily. 32 tablet 5   DYMISTA 137-50 MCG/ACT SUSP Place 1 spray into both nostrils 2 (two) times daily as needed. 23 g 5  EPINEPHrine (EPIPEN 2-PAK) 0.3 mg/0.3 mL IJ SOAJ injection Inject 0.3 mg into the muscle as needed for anaphylaxis. 2 each 2   ergocalciferol (VITAMIN D2) 1.25 MG (50000 UT) capsule Take 1 capsule (50,000 Units total) by mouth once a week. 12 capsule 1   levonorgestrel (MIRENA) 20 MCG/24HR IUD 1 each by Intrauterine route once.      Olopatadine HCl (PATADAY) 0.2 % SOLN Place 1 drop into both eyes daily as needed. 2.5 mL 5   ondansetron (ZOFRAN) 4 MG tablet Take 1 tablet (4 mg total) by mouth every 8 (eight) hours as needed for nausea/vomitng. 20 tablet 0   Current Facility-Administered Medications  Medication Dose Route Frequency Provider Last Rate Last Admin   cetirizine (ZYRTEC) tablet 10 mg  10 mg Oral Daily Verlee Monte, MD   10 mg at 05/10/23 1514   Allergies: Allergies  Allergen Reactions   Kiwi Extract    Lactose Intolerance (Gi) Hives   Other     TREE NUTS   Peanuts [Peanut Oil] Hives   Shrimp [Shellfish Allergy] Hives    Review of Systems-negative except as per HPI Objective: Physical Exam Not obtained as encounter was done via telephone.   Previous notes and tests were reviewed.  I discussed the assessment and treatment plan with the patient. The patient was provided an opportunity to ask questions and all were answered. The patient agreed with the plan and demonstrated an understanding of the instructions.   The patient was advised to call back or seek an in-person evaluation if the symptoms worsen or if the condition fails to improve as anticipated.  I provided 13 minutes of non-face-to-face time during this encounter.  It was my pleasure to participate in [place] Damian's care today. Please feel free to contact me with any questions or concerns.   Sincerely,  Verlee Monte, MD

## 2023-08-10 NOTE — Patient Instructions (Addendum)
Upper Respiratory Illness, improving but with ongoing mucus production:  - take zyrtec 10 mg daily - take nasal saline rinse twice daily followed by dymista 1 spray twice daily - if no improvement in 1-2 days, start mucinex 600 mg (1 tablet) every 12 hours to help dry up mucus. Drink with 1-2 glasses of water. - will have samples ready for you for this afternoon of mucinex and saline rinse.

## 2023-08-17 ENCOUNTER — Ambulatory Visit: Payer: Medicaid Other | Admitting: Internal Medicine

## 2023-09-13 ENCOUNTER — Other Ambulatory Visit (HOSPITAL_BASED_OUTPATIENT_CLINIC_OR_DEPARTMENT_OTHER): Payer: Self-pay

## 2023-09-13 MED ORDER — SULFAMETHOXAZOLE-TRIMETHOPRIM 800-160 MG PO TABS
1.0000 | ORAL_TABLET | Freq: Two times a day (BID) | ORAL | 0 refills | Status: DC
  Filled 2023-09-13: qty 14, 7d supply, fill #0

## 2023-09-13 MED ORDER — FLUCONAZOLE 150 MG PO TABS
150.0000 mg | ORAL_TABLET | ORAL | 0 refills | Status: DC
  Filled 2023-09-13: qty 2, 14d supply, fill #0

## 2023-09-18 ENCOUNTER — Other Ambulatory Visit (HOSPITAL_BASED_OUTPATIENT_CLINIC_OR_DEPARTMENT_OTHER): Payer: Self-pay

## 2023-09-20 ENCOUNTER — Other Ambulatory Visit (HOSPITAL_BASED_OUTPATIENT_CLINIC_OR_DEPARTMENT_OTHER): Payer: Self-pay

## 2023-09-20 MED ORDER — PREDNISONE 5 MG PO TABS
ORAL_TABLET | ORAL | 0 refills | Status: AC
  Filled 2023-09-20: qty 21, 5d supply, fill #0

## 2023-09-20 MED ORDER — DOXYCYCLINE HYCLATE 100 MG PO TABS
100.0000 mg | ORAL_TABLET | Freq: Two times a day (BID) | ORAL | 0 refills | Status: DC
  Filled 2023-09-20: qty 14, 7d supply, fill #0

## 2023-09-21 ENCOUNTER — Other Ambulatory Visit (HOSPITAL_BASED_OUTPATIENT_CLINIC_OR_DEPARTMENT_OTHER): Payer: Self-pay

## 2023-10-13 ENCOUNTER — Other Ambulatory Visit (HOSPITAL_BASED_OUTPATIENT_CLINIC_OR_DEPARTMENT_OTHER): Payer: Self-pay

## 2023-10-13 MED ORDER — VRAYLAR 1.5 MG PO CAPS
1.5000 mg | ORAL_CAPSULE | Freq: Every day | ORAL | 1 refills | Status: AC
  Filled 2023-10-13: qty 30, 30d supply, fill #0

## 2023-10-13 MED ORDER — BUSPIRONE HCL 10 MG PO TABS
10.0000 mg | ORAL_TABLET | Freq: Three times a day (TID) | ORAL | 1 refills | Status: AC
  Filled 2023-10-13: qty 90, 30d supply, fill #0

## 2023-10-24 ENCOUNTER — Other Ambulatory Visit (HOSPITAL_BASED_OUTPATIENT_CLINIC_OR_DEPARTMENT_OTHER): Payer: Self-pay

## 2023-10-26 ENCOUNTER — Other Ambulatory Visit (HOSPITAL_BASED_OUTPATIENT_CLINIC_OR_DEPARTMENT_OTHER): Payer: Self-pay

## 2023-10-26 MED ORDER — MONTELUKAST SODIUM 10 MG PO TABS
10.0000 mg | ORAL_TABLET | Freq: Every day | ORAL | 0 refills | Status: DC
  Filled 2023-10-26: qty 30, 30d supply, fill #0

## 2023-10-30 ENCOUNTER — Other Ambulatory Visit (HOSPITAL_BASED_OUTPATIENT_CLINIC_OR_DEPARTMENT_OTHER): Payer: Self-pay

## 2023-11-01 ENCOUNTER — Other Ambulatory Visit (HOSPITAL_BASED_OUTPATIENT_CLINIC_OR_DEPARTMENT_OTHER): Payer: Self-pay

## 2023-11-02 ENCOUNTER — Other Ambulatory Visit (HOSPITAL_BASED_OUTPATIENT_CLINIC_OR_DEPARTMENT_OTHER): Payer: Self-pay

## 2023-11-02 MED ORDER — PREDNISONE 20 MG PO TABS
20.0000 mg | ORAL_TABLET | Freq: Two times a day (BID) | ORAL | 0 refills | Status: DC
  Filled 2023-11-02: qty 10, 5d supply, fill #0

## 2023-11-03 ENCOUNTER — Ambulatory Visit: Payer: Medicaid Other | Admitting: Internal Medicine

## 2023-11-03 ENCOUNTER — Encounter: Payer: Self-pay | Admitting: Internal Medicine

## 2023-11-03 ENCOUNTER — Other Ambulatory Visit (HOSPITAL_BASED_OUTPATIENT_CLINIC_OR_DEPARTMENT_OTHER): Payer: Self-pay

## 2023-11-03 ENCOUNTER — Other Ambulatory Visit: Payer: Self-pay

## 2023-11-03 VITALS — BP 130/70 | HR 98 | Temp 97.2°F | Resp 16 | Ht 70.0 in | Wt 242.5 lb

## 2023-11-03 DIAGNOSIS — T7800XD Anaphylactic reaction due to unspecified food, subsequent encounter: Secondary | ICD-10-CM

## 2023-11-03 DIAGNOSIS — H1013 Acute atopic conjunctivitis, bilateral: Secondary | ICD-10-CM

## 2023-11-03 DIAGNOSIS — L858 Other specified epidermal thickening: Secondary | ICD-10-CM

## 2023-11-03 DIAGNOSIS — R0602 Shortness of breath: Secondary | ICD-10-CM

## 2023-11-03 DIAGNOSIS — J302 Other seasonal allergic rhinitis: Secondary | ICD-10-CM | POA: Diagnosis not present

## 2023-11-03 DIAGNOSIS — E739 Lactose intolerance, unspecified: Secondary | ICD-10-CM | POA: Diagnosis not present

## 2023-11-03 DIAGNOSIS — J3089 Other allergic rhinitis: Secondary | ICD-10-CM

## 2023-11-03 DIAGNOSIS — T7800XA Anaphylactic reaction due to unspecified food, initial encounter: Secondary | ICD-10-CM

## 2023-11-03 MED ORDER — DYMISTA 137-50 MCG/ACT NA SUSP
1.0000 | Freq: Two times a day (BID) | NASAL | 5 refills | Status: DC | PRN
  Filled 2023-11-03: qty 23, 30d supply, fill #0

## 2023-11-03 MED ORDER — CETIRIZINE HCL 10 MG PO TABS
10.0000 mg | ORAL_TABLET | Freq: Every day | ORAL | 5 refills | Status: DC
  Filled 2023-11-03: qty 32, 32d supply, fill #0
  Filled 2023-12-05: qty 32, 32d supply, fill #1
  Filled 2024-01-04: qty 32, 32d supply, fill #2

## 2023-11-03 NOTE — Patient Instructions (Addendum)
Seasonal and Perennial Allergic: - allergy testing 05/10/23: positive to grass, weed and tree pollen, indoor mold (penicillium-not penicillin; okay to take penicillins) - intradermals positive to cat, dog and cockroach - Prevention:  - allergen avoidance when possible - consider allergy shots as long term control of your symptoms by teaching your immune system to be more tolerant of your allergy triggers - Symptom control: - Continue Dymista 1 spray in each nostril twice daily as needed - Continue Antihistamine: daily or daily as needed.   -Options include Zyrtec (Cetirizine) 10mg , Claritin (Loratadine) 10mg , Allegra (Fexofenadine) 180mg , or Xyzal (Levocetirinze) 5mg  - Can be purchased over-the-counter if not covered by insurance. Do not use Claritin-D (or any other medications with D) as this is a decongestant, can worsen your symptoms over time and can cause increased blood pressure.  Allergic Conjunctivitis:  - Continue Allergy Eye drops-great option includes Zaditor (ketotifen) for eye symptoms daily as needed-can purchase over the counter -Avoid eye drops that say red eye relief as they may contain medications that dry out your eyes.  Food allergy: unclear if initial symptoms due to shellfish allergy or viral illness, continue prednisone as prescribed at urgent care. Ongoing symptoms not concerning for ongoing reaction but rather viral. - 05/10/23 skin testing was positive to peanut, scallops, oyster, pecan, and pistachio - please strictly avoid peanuts, tree nuts, shellfish, kiwi - for SKIN only reaction, okay to take Benadryl 2 capsules every 4 hours - for SKIN + ANY additional symptoms, OR IF concern for LIFE THREATENING reaction = Epipen Autoinjector EpiPen 0.3 mg. - If using Epinephrine autoinjector, call 911 - A food allergy action plan has been provided and discussed. - Medic Alert identification is recommended.  Lactose Intolerance- Milk allergy testing 05/10/23 is negative -  this is not an allergy but rather a problem with breaking down dairy (due to an enzyme deficiency) which can lead to bloating, gas, diarrhea, nausea and vomiting - choose lactose free dairy or take lactaid prior to eating dairy products that contain lactose  Rash: Keratosis Pilaris:  - this is a benign condition - use an over-the-counter lotion containing lactic acid or ammonium lactate (such as LacHydrin or CeraVe S.A.) up to twice daily on bumpy skin - sunscreen using an SPF of 30 or greater is highly encouraged  Follow up : 7 months, sooner if needed It was a pleasure seeing you again in clinic today! Thank you for allowing me to participate in your care.  Tonny Bollman, MD [facility]  Reducing Pollen Exposure  The American Academy of Allergy, Asthma and Immunology suggests the following steps to reduce your exposure to pollen during allergy seasons.    Do not hang sheets or clothing out to dry; pollen may collect on these items. Do not mow lawns or spend time around freshly cut grass; mowing stirs up pollen. Keep windows closed at night.  Keep car windows closed while driving. Minimize morning activities outdoors, a time when pollen counts are usually at their highest. Stay indoors as much as possible when pollen counts or humidity is high and on windy days when pollen tends to remain in the air longer. Use air conditioning when possible.  Many air conditioners have filters that trap the pollen spores. Use a HEPA room air filter to remove pollen form the indoor air you breathe. Control of Mold Allergen   Mold and fungi can grow on a variety of surfaces provided certain temperature and moisture conditions exist.  Outdoor  molds grow on plants, decaying vegetation and soil.  The major outdoor mold, Alternaria and Cladosporium, are found in very high numbers during hot and dry conditions.  Generally, a late Summer - Fall peak is seen for common outdoor fungal spores.   Rain will temporarily lower outdoor mold spore count, but counts rise rapidly when the rainy period ends.  The most important indoor molds are Aspergillus and Penicillium.  Dark, humid and poorly ventilated basements are ideal sites for mold growth.  The next most common sites of mold growth are the bathroom and the kitchen.  Outdoor (Seasonal) Mold Control  Use air conditioning and keep windows closed Avoid exposure to decaying vegetation. Avoid leaf raking. Avoid grain handling. Consider wearing a face mask if working in moldy areas.    Indoor (Perennial) Mold Control   Maintain humidity below 50%. Clean washable surfaces with 5% bleach solution. Remove sources e.g. contaminated carpets.  Control of Dog or Cat Allergen  Avoidance is the best way to manage a dog or cat allergy. If you have a dog or cat and are allergic to dog or cats, consider removing the dog or cat from the home. If you have a dog or cat but don't want to find it a new home, or if your family wants a pet even though someone in the household is allergic, here are some strategies that may help keep symptoms at bay:  Keep the pet out of your bedroom and restrict it to only a few rooms. Be advised that keeping the dog or cat in only one room will not limit the allergens to that room. Don't pet, hug or kiss the dog or cat; if you do, wash your hands with soap and water. High-efficiency particulate air (HEPA) cleaners run continuously in a bedroom or living room can reduce allergen levels over time. Regular use of a high-efficiency vacuum cleaner or a central vacuum can reduce allergen levels. Giving your dog or cat a bath at least once a week can reduce airborne allergen. Control of Cockroach Allergen  Cockroach allergen has been identified as an important cause of acute attacks of asthma, especially in urban settings.  There are fifty-five species of cockroach that exist in the [place], however only three, the  [place], [place] species produce allergen that can affect patients with Asthma.  Allergens can be obtained from fecal particles, egg casings and secretions from cockroaches.    Remove food sources. Reduce access to water. Seal access and entry points. Spray runways with 0.5-1% Diazinon or Chlorpyrifos Blow boric acid power under stoves and refrigerator. Place bait stations (hydramethylnon) at feeding sites.

## 2023-11-03 NOTE — Progress Notes (Signed)
FOLLOW UP Date of Service/Encounter:  11/03/23  Subjective:  Emily Villanueva (DOB: 1996/03/08) is a 28 y.o. female who [facility] in re-evaluation of the following: acute visit for allergic reaction History obtained from: chart review and patient.  For Review, LV was on 08/10/23  with Dr.Jesalyn Finazzo seen for  tele visit for suspected upper respiratory illness and mucus . See below for summary of history and diagnostics.   Therapeutic plans/changes recommended: consider mucinex and saline rinses as discussed. ----------------------------------------------------- Pertinent History/Diagnostics:  Allergic Rhinitis:  nasal congestion, rhinorrhea, watery eyes, and itchy eyes,post nasal drip, sneezing attacks Occurs year-round - SPT environmental panel (05/10/23):  positive to grass, weed and tree pollen, indoor mold (penicillium-not penicillin; okay to take penicillins) - intradermals positive to cat, dog and cockroach Food Allergy:  Kiwi: feels like acid swishing in her mouth Dairy: extreme abdominal pain, but tolerates lactose free dairy. Almond: throat itchy/scratchy, hoarseness, told her throat was closing at an UC Peanuts: ate reese's cup years ago and felt like her throat was closing. Shellfish: rash, no other symptoms except maybe abdominal pain. Avoids fish due to reaction with shellfish. She does report a recent exposure to tree nuts in an ice cream and developed immediate throat and mouth itching. Resolved with benadryl PRN.  Reviewed previous allergy appointment with Dr. Elijah Birk ([facility]): seen 2021 for concern for food allergies-reported upset stomach and rashes, food intolerance suspected and labs ordered  05/12/20: sIgE: positive to walnut 0.74, pistachio 0.82, pecan 0.11, almonds 0.81, [place] nut 0.39, hazelnut 0.69, peanut 0.80, cashew < 0.10 Shellfish clams 0.36, crab 0.45, lobster 0.26, oyster 0.29 shrimp 0.20, scallop Patient told  testing was negative.  - 05/10/23 skin testing was positive to peanut, scallops, oyster, pecan, and pistachio - labs 05/10/23: low level positive to shellfish-challenge offered, positive to peanuts and tree nuts, negative to fish. Advised to avoid nuts, shellfish, okay to introduce fish if desire. --------------------------------------------------- Today presents for follow-up. Discussed the use of AI scribe software for clinical note transcription with the patient, who gave verbal consent to proceed.  History of Present Illness   The patient, with multiple food allergies, presents with an allergic reaction after consuming shrimp.  She accidentally consumed shrimp and began experiencing symptoms immediately, although she did not recognize them until the next day. Initially, her face turned red, and she felt a sensation of coldness when splashing water on her face. Later, she felt unwell and took Alka-Seltzer, but her condition worsened overnight with symptoms including drooling, difficulty breathing, and loss of voice. The following day, she realized the symptoms might be related to shrimp consumption and went to urgent care, where she was advised to use her EpiPen, which she did not have at the time. She was kept under observation for a few hours and treated with decadron injection and told to continue prednisone orally for the following 4 days. She is on day 3 of therapy.  She has a history of allergies to peanuts, tree nuts, and kiwi, and experiences severe abdominal pain with wheat, which she attributes to gluten intolerance. She also reports a reaction to heavy cream, which she attributes to lactose intolerance, causing gastrointestinal symptoms. She does tolerate milk without problems.  She has had reactions to shellfish in the past but felt this was somewhat delayed.  She reports her daughter was tested for flu and strep, both of which were negative. She suspects she might have a viral  infection due to symptoms  like coughing and sore throat which have been progressively worsening since the time she ingested shrimp including now a hoarse voice.  She has been exposed to infectious organisms via her daughter who was sick with sore throat.  She mentions a rash, on her arm, which has been red but present for many years. Her PCP saw the rash and was concerned for hives and started her on montelukast for allergies. She was told to report to allergist.    Chart Review: 11/01/23-seen at [facility] for allergic reaction after eating shellfish  All medications reviewed by clinical staff and updated in chart. No new pertinent medical or surgical history except as noted in HPI.  ROS: All others negative except as noted per HPI.   Objective:  BP 130/70   Pulse 98   Temp (!) 97.2 F (36.2 C) (Temporal)   Resp 16   Ht 5\' 10"  (1.778 m)   Wt 242 lb 8 oz (110 kg)   SpO2 99%   BMI 34.80 kg/m  Body mass index is 34.8 kg/m. Physical Exam: General Appearance:  Alert, cooperative, no distress, appears stated age, hoarse  Head:  Normocephalic, without obvious abnormality, atraumatic  Eyes:  Conjunctiva clear, EOM's intact  Ears EACs normal bilaterally and normal TMs bilaterally  Nose: Nares normal, hypertrophic turbinates, normal mucosa, no visible anterior polyps, and septum midline  Throat: Lips, tongue normal; teeth and gums normal, normal posterior oropharynx  Neck: Supple, symmetrical  Lungs:   clear to auscultation bilaterally, Respirations unlabored, no coughing  Heart:  regular rate and rhythm and no murmur, Appears well perfused  Extremities: No edema  Skin: Erythematous pinpoint papules on bilateral upper extermities  Neurologic: No gross deficits   Labs:  Lab Orders  No laboratory test(s) ordered today    Spirometry:  Tracings reviewed. Her effort: Good reproducible efforts. FVC: 3.41L FEV1: 3.19L, 82% predicted FEV1/FVC ratio: 0.94 Interpretation: Spirometry consistent  with normal pattern. Reduced FVC Please see scanned spirometry results for details.  Assessment/Plan   Recent ingestion of shrimp followed by facial flushing and respiratory symptoms. Unclear if symptoms were due to shellfish allergy or concurrent viral illness. Symptoms of cough, sore throat, and general malaise. Likely viral in nature given the context. Rash on arms consistent with keratosis pilaris and unrelated to current symptoms. Discussed stopping montelukast. Breathing test was normal despite complaints of trouble breathing, suspected related to her current viral illness. No prior history of asthma.  Unclear if current episode solely due to viral illness or if initially having shrimp reaction. Testing borderline. Discussed returning in August for repeat shellfish testing and consider challenge based on results.   Seasonal and Perennial Allergic: controlled. - allergy testing 05/10/23 positive to grass, weed and tree pollen, indoor mold (penicillium-not penicillin; okay to take penicillins) - intradermals positive to cat, dog and cockroach - Prevention:  - allergen avoidance when possible - consider allergy shots as long term control of your symptoms by teaching your immune system to be more tolerant of your allergy triggers - Symptom control: - Continue Dymista 1 spray in each nostril twice daily as needed - Continue Antihistamine: daily or daily as needed.   -Options include Zyrtec (Cetirizine) 10mg , Claritin (Loratadine) 10mg , Allegra (Fexofenadine) 180mg , or Xyzal (Levocetirinze) 5mg  - Can be purchased over-the-counter if not covered by insurance. Do not use Claritin-D (or any other medications with D) as this is a decongestant, can worsen your symptoms over time and can cause increased blood pressure.  Allergic Conjunctivitis: at goal -  Continue Allergy Eye drops-great option includes Zaditor (ketotifen) for eye symptoms daily as needed-can purchase over the counter -Avoid eye  drops that say red eye relief as they may contain medications that dry out your eyes.  Food allergy: unclear if initial symptoms due to shellfish allergy or viral illness, continue prednisone as prescribed at urgent care. Ongoing symptoms not concerning for ongoing reaction but rather viral. - 05/10/23 skin testing was positive to peanut, scallops, oyster, pecan, and pistachio - please strictly avoid peanuts, tree nuts, shellfish, kiwi - for SKIN only reaction, okay to take Benadryl 2 capsules every 4 hours - for SKIN + ANY additional symptoms, OR IF concern for LIFE THREATENING reaction = Epipen Autoinjector EpiPen 0.3 mg. - If using Epinephrine autoinjector, call 911 - A food allergy action plan has been provided and discussed. - Medic Alert identification is recommended.  Lactose Intolerance-stab;e Milk allergy testing 05/10/23 is negative - this is not an allergy but rather a problem with breaking down dairy (due to an enzyme deficiency) which can lead to bloating, gas, diarrhea, nausea and vomiting - choose lactose free dairy or take lactaid prior to eating dairy products that contain lactose  Rash: Keratosis Pilaris: newly addressed problem - this is a benign condition - use an over-the-counter lotion containing lactic acid or ammonium lactate (such as LacHydrin or CeraVe S.A.) up to twice daily on bumpy skin - sunscreen using an SPF of 30 or greater is highly encouraged  Follow up : 7 months, sooner if needed It was a pleasure seeing you again in clinic today! Thank you for allowing me to participate in your care.  Other: none  Tonny Bollman, MD  [facility]

## 2023-11-06 ENCOUNTER — Other Ambulatory Visit (HOSPITAL_BASED_OUTPATIENT_CLINIC_OR_DEPARTMENT_OTHER): Payer: Self-pay

## 2023-11-16 ENCOUNTER — Other Ambulatory Visit: Payer: Self-pay

## 2023-11-16 ENCOUNTER — Emergency Department (HOSPITAL_BASED_OUTPATIENT_CLINIC_OR_DEPARTMENT_OTHER)
Admission: EM | Admit: 2023-11-16 | Discharge: 2023-11-16 | Disposition: A | Discharge status: Home / Self Care | Payer: Medicaid Other | Attending: Emergency Medicine | Admitting: Emergency Medicine

## 2023-11-16 ENCOUNTER — Encounter (HOSPITAL_BASED_OUTPATIENT_CLINIC_OR_DEPARTMENT_OTHER): Payer: Self-pay | Admitting: Radiology

## 2023-11-16 ENCOUNTER — Other Ambulatory Visit (HOSPITAL_BASED_OUTPATIENT_CLINIC_OR_DEPARTMENT_OTHER): Payer: Self-pay

## 2023-11-16 ENCOUNTER — Emergency Department (HOSPITAL_BASED_OUTPATIENT_CLINIC_OR_DEPARTMENT_OTHER): Payer: Medicaid Other

## 2023-11-16 DIAGNOSIS — R112 Nausea with vomiting, unspecified: Secondary | ICD-10-CM | POA: Diagnosis present

## 2023-11-16 DIAGNOSIS — R197 Diarrhea, unspecified: Secondary | ICD-10-CM | POA: Diagnosis not present

## 2023-11-16 DIAGNOSIS — R109 Unspecified abdominal pain: Secondary | ICD-10-CM | POA: Insufficient documentation

## 2023-11-16 DIAGNOSIS — Z20822 Contact with and (suspected) exposure to covid-19: Secondary | ICD-10-CM | POA: Insufficient documentation

## 2023-11-16 LAB — CBC WITH DIFFERENTIAL/PLATELET
Abs Immature Granulocytes: 0.09 10*3/uL — ABNORMAL HIGH (ref 0.00–0.07)
Basophils Absolute: 0 10*3/uL (ref 0.0–0.1)
Basophils Relative: 0 %
Eosinophils Absolute: 0.1 10*3/uL (ref 0.0–0.5)
Eosinophils Relative: 0 %
HCT: 50.1 % — ABNORMAL HIGH (ref 36.0–46.0)
Hemoglobin: 17 g/dL — ABNORMAL HIGH (ref 12.0–15.0)
Immature Granulocytes: 0 %
Lymphocytes Relative: 3 %
Lymphs Abs: 0.7 10*3/uL (ref 0.7–4.0)
MCH: 31 pg (ref 26.0–34.0)
MCHC: 33.9 g/dL (ref 30.0–36.0)
MCV: 91.4 fL (ref 80.0–100.0)
Monocytes Absolute: 1.1 10*3/uL — ABNORMAL HIGH (ref 0.1–1.0)
Monocytes Relative: 5 %
Neutro Abs: 18.9 10*3/uL — ABNORMAL HIGH (ref 1.7–7.7)
Neutrophils Relative %: 92 %
Platelets: 237 10*3/uL (ref 150–400)
RBC: 5.48 MIL/uL — ABNORMAL HIGH (ref 3.87–5.11)
RDW: 12.2 % (ref 11.5–15.5)
WBC: 20.8 10*3/uL — ABNORMAL HIGH (ref 4.0–10.5)
nRBC: 0 % (ref 0.0–0.2)

## 2023-11-16 LAB — COMPREHENSIVE METABOLIC PANEL
ALT: 36 U/L (ref 0–44)
AST: 29 U/L (ref 15–41)
Albumin: 4.8 g/dL (ref 3.5–5.0)
Alkaline Phosphatase: 59 U/L (ref 38–126)
Anion gap: 12 (ref 5–15)
BUN: 19 mg/dL (ref 6–20)
CO2: 24 mmol/L (ref 22–32)
Calcium: 10.1 mg/dL (ref 8.9–10.3)
Chloride: 103 mmol/L (ref 98–111)
Creatinine, Ser: 0.91 mg/dL (ref 0.44–1.00)
GFR, Estimated: >60 mL/min (ref >60)
Glucose, Bld: 126 mg/dL — ABNORMAL HIGH (ref 70–99)
Potassium: 4.1 mmol/L (ref 3.5–5.1)
Sodium: 139 mmol/L (ref 135–145)
Total Bilirubin: 1.1 mg/dL (ref 0.0–1.2)
Total Protein: 7.8 g/dL (ref 6.5–8.1)

## 2023-11-16 LAB — RESP PANEL BY RT-PCR (RSV, FLU A&B, COVID)  RVPGX2
Influenza A by PCR: NEGATIVE
Influenza B by PCR: NEGATIVE
Resp Syncytial Virus by PCR: NEGATIVE
SARS Coronavirus 2 by RT PCR: NEGATIVE

## 2023-11-16 LAB — URINALYSIS, ROUTINE W REFLEX MICROSCOPIC
Bilirubin Urine: NEGATIVE
Glucose, UA: NEGATIVE mg/dL
Hgb urine dipstick: NEGATIVE
Ketones, ur: NEGATIVE mg/dL
Leukocytes,Ua: NEGATIVE
Nitrite: NEGATIVE
Protein, ur: NEGATIVE mg/dL
Specific Gravity, Urine: 1.015 (ref 1.005–1.030)
pH: 6.5 (ref 5.0–8.0)

## 2023-11-16 LAB — HCG, QUANTITATIVE, PREGNANCY: hCG, Beta Chain, Quant, S: <1 m[IU]/mL (ref <5)

## 2023-11-16 LAB — MAGNESIUM: Magnesium: 1.6 mg/dL — ABNORMAL LOW (ref 1.7–2.4)

## 2023-11-16 LAB — HCG, SERUM, QUALITATIVE: Preg, Serum: NEGATIVE

## 2023-11-16 LAB — LIPASE, BLOOD: Lipase: 33 U/L (ref 11–51)

## 2023-11-16 MED ORDER — PROMETHAZINE HCL 25 MG PO TABS
25.0000 mg | ORAL_TABLET | Freq: Four times a day (QID) | ORAL | 0 refills | Status: DC | PRN
  Filled 2023-11-16: qty 30, 8d supply, fill #0

## 2023-11-16 MED ORDER — DROPERIDOL 2.5 MG/ML IJ SOLN
1.2500 mg | Freq: Once | INTRAMUSCULAR | Status: AC
  Administered 2023-11-16: 1.25 mg via INTRAVENOUS
  Filled 2023-11-16: qty 2

## 2023-11-16 MED ORDER — IOHEXOL 300 MG/ML  SOLN
100.0000 mL | Freq: Once | INTRAMUSCULAR | Status: AC | PRN
  Administered 2023-11-16: 100 mL via INTRAVENOUS

## 2023-11-16 MED ORDER — DICYCLOMINE HCL 20 MG PO TABS
20.0000 mg | ORAL_TABLET | Freq: Two times a day (BID) | ORAL | 0 refills | Status: DC
  Filled 2023-11-16: qty 20, 10d supply, fill #0

## 2023-11-16 MED ORDER — ACETAMINOPHEN 500 MG PO TABS
1000.0000 mg | ORAL_TABLET | Freq: Once | ORAL | Status: AC
  Administered 2023-11-16: 1000 mg via ORAL
  Filled 2023-11-16: qty 2

## 2023-11-16 MED ORDER — SODIUM CHLORIDE 0.9 % IV BOLUS
1000.0000 mL | Freq: Once | INTRAVENOUS | Status: AC
  Administered 2023-11-16: 1000 mL via INTRAVENOUS

## 2023-11-16 MED ORDER — ONDANSETRON HCL 4 MG/2ML IJ SOLN
4.0000 mg | Freq: Once | INTRAMUSCULAR | Status: AC
  Administered 2023-11-16: 4 mg via INTRAVENOUS
  Filled 2023-11-16: qty 2

## 2023-11-16 MED ORDER — MAGNESIUM SULFATE 2 GM/50ML IV SOLN: 2.0000 g | Freq: Once | INTRAVENOUS | Status: DC

## 2023-11-16 MED ORDER — ACETAMINOPHEN ER 650 MG PO TBCR
650.0000 mg | EXTENDED_RELEASE_TABLET | Freq: Three times a day (TID) | ORAL | 0 refills | Status: AC | PRN
  Filled 2023-11-16: qty 100, 34d supply, fill #0

## 2023-11-16 MED ORDER — MORPHINE SULFATE (PF) 4 MG/ML IV SOLN
4.0000 mg | Freq: Once | INTRAVENOUS | Status: AC
  Administered 2023-11-16: 4 mg via INTRAVENOUS
  Filled 2023-11-16: qty 1

## 2023-11-16 MED ORDER — ONDANSETRON 4 MG PO TBDP
4.0000 mg | ORAL_TABLET | Freq: Three times a day (TID) | ORAL | 0 refills | Status: DC | PRN
  Filled 2023-11-16: qty 20, 7d supply, fill #0

## 2023-11-16 MED ORDER — PROMETHAZINE HCL 25 MG RE SUPP
25.0000 mg | Freq: Four times a day (QID) | RECTAL | 0 refills | Status: DC | PRN
  Filled 2023-11-16: qty 12, 3d supply, fill #0

## 2023-11-16 MED ORDER — MAGNESIUM SULFATE IN D5W 1-5 GM/100ML-% IV SOLN
1.0000 g | Freq: Once | INTRAVENOUS | Status: AC
  Administered 2023-11-16: 1 g via INTRAVENOUS
  Filled 2023-11-16: qty 100

## 2023-11-16 NOTE — ED Notes (Signed)
Patient transported to CT

## 2023-11-16 NOTE — ED Triage Notes (Signed)
Pt reports 12 episodes of emesis that started this AM. Reports feeling lightheaded. Daughter diagnoses with noravirus.  Went to Urgent care and sent here. Given 4mg  IM zofran.

## 2023-11-16 NOTE — ED Provider Notes (Signed)
 [facility] Provider Note   CSN: [id number] Arrival date & time: 11/16/23  7829     History  Chief Complaint  Patient presents with   Emesis    Emily Villanueva is a 27 y.o. female.   Emesis   28 year old female presents emergency department with complaints of nausea, willing, abdominal cramping, diarrhea.  Patient reports daughter ill at home with norovirus that she became ill with on Sunday.  Patient states that she woke up around 630 this morning with abdominal cramping and vomiting.  Reports 12+ episodes of emesis since symptom onset.  Describes abdominal pain as cramping coming in waves associated with bouts of emesis.  States that just prior to being seen in the emergency department, developed an episode or 2 of diarrhea.  Was seen in urgent care earlier today and given Phenergan as well as Zofran with some improvement of symptoms but due to persistence abdominal pain, increased heart rate and feelings of nausea, presents emergency department.  Denies any fever, chills, chest pain, shortness of breath, hematemesis, urinary symptoms, hematochezia/melena.  Denies any marijuana use.  Patient with pretty extensive history of food allergies causing GI issues but denies any changes in diet recently.  Past medical history significant for depression, anxiety, scoliosis  Home Medications Prior to Admission medications   Medication Sig Start Date End Date Taking? Authorizing Provider  acetaminophen (TYLENOL 8 HOUR) 650 MG CR tablet Take 1 tablet (650 mg total) by mouth every 8 (eight) hours as needed for pain or fever. 11/16/23  Yes Sherian Maroon A, PA  dicyclomine (BENTYL) 20 MG tablet Take 1 tablet (20 mg total) by mouth 2 (two) times daily. 11/16/23  Yes Sherian Maroon A, PA  ondansetron (ZOFRAN-ODT) 4 MG disintegrating tablet Take 1 tablet (4 mg total) by mouth every 8 (eight) hours as needed. 11/16/23  Yes Peter Garter, PA  promethazine  (PHENERGAN) 25 MG suppository Place 1 suppository (25 mg total) rectally every 6 (six) hours as needed for nausea or vomiting. 11/16/23  Yes Peter Garter, PA  promethazine (PHENERGAN) 25 MG tablet Take 1 tablet (25 mg total) by mouth every 6 (six) hours as needed for nausea or vomiting. 11/16/23  Yes Sherian Maroon A, PA  busPIRone (BUSPAR) 10 MG tablet Take 1 tablet (10 mg total) by mouth 3 (three) times daily. 10/13/23     cariprazine (VRAYLAR) 1.5 MG capsule Take 1 capsule (1.5 mg total) by mouth daily. 10/13/23     cetirizine (ZYRTEC ALLERGY) 10 MG tablet Take 1 tablet (10 mg total) by mouth daily. 11/03/23   Verlee Monte, MD  doxycycline (VIBRA-TABS) 100 MG tablet Take 1 tablet (100 mg total) by mouth 2 (two) times daily for 7 days 09/20/23     DYMISTA 137-50 MCG/ACT SUSP Place 1 spray into both nostrils 2 (two) times daily as needed. 11/03/23   Verlee Monte, MD  EPINEPHrine (EPIPEN 2-PAK) 0.3 mg/0.3 mL IJ SOAJ injection Inject 0.3 mg into the muscle as needed for anaphylaxis. 05/10/23   Verlee Monte, MD  ergocalciferol (VITAMIN D2) 1.25 MG (50000 UT) capsule Take 1 capsule (50,000 Units total) by mouth once a week. 06/06/23     fluconazole (DIFLUCAN) 150 MG tablet Take 1 (one) tablet by mouth weekly, Take 1 tablet now and 1 tablet after the completion of antibiotic. 09/13/23     levonorgestrel (MIRENA) 20 MCG/24HR IUD 1 each by Intrauterine route once.    [provider]  ondansetron (ZOFRAN) 4 MG tablet Take 1 tablet (4 mg total) by mouth every 8 (eight) hours as needed for nausea/vomitng. 08/08/23     predniSONE (DELTASONE) 20 MG tablet Take 1 tablet (20 mg total) by mouth 2 (two) times daily for 5 days. 11/02/23     sulfamethoxazole-trimethoprim (BACTRIM DS) 800-160 MG tablet Take 1 tablet by mouth 2 (two) times daily. 09/13/23     famotidine (PEPCID) 40 MG tablet Take 1 tablet (40 mg total) by mouth 2 (two) times daily for 10 days. 03/16/23 04/02/23    fexofenadine (ALLEGRA) 60 MG  tablet Take 1 tablet (60 mg total) by mouth 3 (three) times daily. 03/16/23 04/02/23        Allergies    Kiwi extract, Lactose intolerance (gi), Other, Peanuts [peanut oil], and Shrimp [shellfish allergy]    Review of Systems   Review of Systems  Gastrointestinal:  Positive for vomiting.  All other systems reviewed and are negative.   Physical Exam Updated Vital Signs BP 102/71   Pulse (!) 110   Temp 99.3 F (37.4 C)   Resp 17   Ht 5\' 10"  (1.778 m)   Wt 107.5 kg   SpO2 100%   BMI 34.01 kg/m  Physical Exam Vitals and nursing note reviewed.  Constitutional:      General: She is not in acute distress.    Appearance: She is well-developed.  HENT:     Head: Normocephalic and atraumatic.  Eyes:     Conjunctiva/sclera: Conjunctivae normal.  Cardiovascular:     Rate and Rhythm: Normal rate and regular rhythm.     Heart sounds: No murmur heard. Pulmonary:     Effort: Pulmonary effort is normal. No respiratory distress.     Breath sounds: Normal breath sounds.  Abdominal:     Palpations: Abdomen is soft.     Tenderness: There is abdominal tenderness. There is no right CVA tenderness, left CVA tenderness or guarding.  Musculoskeletal:        General: No swelling.     Cervical back: Neck supple.  Skin:    General: Skin is warm and dry.     Capillary Refill: Capillary refill takes less than 2 seconds.  Neurological:     Mental Status: She is alert.  Psychiatric:        Mood and Affect: Mood normal.     ED Results / Procedures / Treatments   Labs (all labs ordered are listed, but only abnormal results are displayed) Labs Reviewed  COMPREHENSIVE METABOLIC PANEL - Abnormal; Notable for the following components:      Result Value   Glucose, Bld 126 (*)    All other components within normal limits  CBC WITH DIFFERENTIAL/PLATELET - Abnormal; Notable for the following components:   WBC 20.8 (*)    RBC 5.48 (*)    Hemoglobin 17.0 (*)    HCT 50.1 (*)    Neutro Abs 18.9 (*)     Monocytes Absolute 1.1 (*)    Abs Immature Granulocytes 0.09 (*)    All other components within normal limits  MAGNESIUM - Abnormal; Notable for the following components:   Magnesium 1.6 (*)    All other components within normal limits  RESP PANEL BY RT-PCR (RSV, FLU A&B, COVID)  RVPGX2  LIPASE, BLOOD  URINALYSIS, ROUTINE W REFLEX MICROSCOPIC  HCG, QUANTITATIVE, PREGNANCY  HCG, SERUM, QUALITATIVE    EKG None  Radiology CT ABDOMEN PELVIS W CONTRAST Result Date: 11/16/2023 CLINICAL DATA:  Right lower quadrant  abdominal pain. Nausea and vomiting. EXAM: CT ABDOMEN AND PELVIS WITH CONTRAST TECHNIQUE: Multidetector CT imaging of the abdomen and pelvis was performed using the standard protocol following bolus administration of intravenous contrast. RADIATION DOSE REDUCTION: This exam was performed according to the departmental dose-optimization program which includes automated exposure control, adjustment of the mA and/or kV according to patient size and/or use of iterative reconstruction technique. CONTRAST:  OMNIPAQUE IOHEXOL 300 MG/ML  SOLN COMPARISON:  None Available. FINDINGS: Lower chest: No acute abnormality. Hepatobiliary: No focal liver abnormality is seen. No gallstones, gallbladder wall thickening, or biliary dilatation. Pancreas: Unremarkable. No pancreatic ductal dilatation or surrounding inflammatory changes. Spleen: Spleen is enlarged measuring up to 13 cm in craniocaudal dimension. No focal abnormality. Adrenals/Urinary Tract: Adrenal glands are unremarkable. Kidneys are normal, without renal calculi, focal lesion, or hydronephrosis. Bladder is unremarkable. Stomach/Bowel: Stomach is within normal limits. Appendix appears normal. No evidence of bowel wall thickening, distention, or inflammatory changes. Vascular/Lymphatic: The abdominal aorta is normal in caliber. Retroaortic left renal vein. No enlarged abdominal or pelvic lymph nodes. Reproductive: IUD in place.  Bilateral  adnexa are unremarkable. Other: No significant abdominopelvic ascites. No intraperitoneal free air. Tiny fat containing umbilical hernia. Musculoskeletal: No acute or significant osseous findings. IMPRESSION: 1. No acute localizing findings in the abdomen or pelvis. 2. Mild splenomegaly. Electronically Signed   By: Hart Robinsons M.D.   On: 11/16/2023 15:32    Procedures Procedures    Medications Ordered in ED Medications  sodium chloride 0.9 % bolus 1,000 mL (0 mLs Intravenous Stopped 11/16/23 1217)  droperidol (INAPSINE) 2.5 MG/ML injection 1.25 mg (1.25 mg Intravenous Given 11/16/23 1025)  sodium chloride 0.9 % bolus 1,000 mL (0 mLs Intravenous Stopped 11/16/23 1625)  iohexol (OMNIPAQUE) 300 MG/ML solution 100 mL (100 mLs Intravenous Contrast Given 11/16/23 1318)  magnesium sulfate IVPB 1 g 100 mL (0 g Intravenous Stopped 11/16/23 1624)  morphine (PF) 4 MG/ML injection 4 mg (4 mg Intravenous Given 11/16/23 1529)  ondansetron (ZOFRAN) injection 4 mg (4 mg Intravenous Given 11/16/23 1529)  acetaminophen (TYLENOL) tablet 1,000 mg (1,000 mg Oral Given 11/16/23 1623)    ED Course/ Medical Decision Making/ A&P Clinical Course as of 11/16/23 1806  Thu Nov 16, 2023  1724 Reevaluate the patient feeling much better.  Was unaware that she needed to give urine sample.  Awaiting UA for discharge. [CR]    Clinical Course User Index [CR] Peter Garter, PA                                 Medical Decision Making Amount and/or Complexity of Data Reviewed Labs: ordered. Radiology: ordered.  Risk OTC drugs. Prescription drug management.   This patient presents to the ED for concern of nausea vomiting diarrhea, this involves an extensive number of treatment options, and is a complaint that carries with it a high risk of complications and morbidity.  The differential diagnosis includes gastroenteritis diverticulitis, appendicitis, SBO/LBO, volvulus, CBD pathology, cholecystitis, other   Co  morbidities that complicate the patient evaluation  See HPI   Additional history obtained:  Additional history obtained from EMR External records from outside source obtained and reviewed including hospital records   Lab Tests:  I Ordered, and personally interpreted labs.  The pertinent results include: Leukocytosis to 20.8 with left shift.  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  Platelets within range.  Nausea abnormalities.  No transaminitis.  No renal dysfunction.  UA  without abnormality.  Viral panel negative.  Hypomagnesia of 1.6.  Lipase within normal limits.  hCG negative.   Imaging Studies ordered:  I ordered imaging studies including CT abdomen pelvis I independently visualized and interpreted imaging which showed no acute abnormality I agree with the radiologist interpretation   Cardiac Monitoring: / EKG:  The patient was maintained on a cardiac monitor.  I personally viewed and interpreted the cardiac monitored which showed an underlying rhythm of: Sinus rhythm   Consultations Obtained:  N/a   Problem List / ED Course / Critical interventions / Medication management  Nausea, vomiting, diarrhea I ordered medication including normal saline, magnesium sulfate, Tylenol, morphine, Zofran, droperidol   Reevaluation of the patient after these medicines showed that the patient improved I have reviewed the patients home medicines and have made adjustments as needed   Social Determinants of Health:  Cigarette use.  Denies illicit drug use   Test / Admission - Considered:  Nausea, vomiting, diarrhea Vitals signs significant for tachycardia. Otherwise within normal range and stable throughout visit. Laboratory/imaging studies significant for: See above 28 year old female presents emergency department complaints of abdominal pain, nausea, vomiting, diarrhea.  Symptom onset this morning.  Patient with known exposure through her daughter who tested positive for norovirus and is  ill with similar symptoms since the weekend.  On exam, patient ill-appearing.  Tenderness epigastrium as well as right upper quadrant.  Labs concerning for leukocytosis of 28.  Given patient's somewhat focal tenderness in the lower quadrant, leukocytosis, CT imaging was obtained of which is negative for acute abnormality.  Patient intermittently with tachycardia sometimes reaching 150s 160s the patient reports that her pain was more appreciable during these episodes of tachycardia and improved as her pain resolved.  Somewhat transient nature only lasting a couple minutes before returning to the upper 90s.  Treated with IV fluids and did note improvement of symptoms subjectively as well as improvement of initial heart rate.  Treated with antiemetic with subsequent tolerance of p.o.  Suspect the patient symptoms likely secondary to viral gastroenteritis given reassuring CT study in the setting of known exposure through her daughter.  Will recommend dietary changes, antiemetic as needed and follow-up with PCP in the outpatient setting.  Treatment plan discussed at length with patient and she acknowledged understanding was agreeable to said plan.  Patient overall well-appearing, afebrile in no acute distress. Worrisome signs and symptoms were discussed with the patient, and the patient acknowledged understanding to return to the ED if noticed. Patient was stable upon discharge.          Final Clinical Impression(s) / ED Diagnoses Final diagnoses:  Nausea vomiting and diarrhea    Rx / DC Orders ED Discharge Orders     None         Peter Garter, [place] 11/16/23 1807    Sloan Leiter, DO 11/20/23 [phone]

## 2023-11-16 NOTE — ED Notes (Signed)

## 2023-11-16 NOTE — Discharge Instructions (Signed)
As discussed, workup today overall reassuring.  CT imaging of your abdomen was normal.  Will send you home with a couple different nausea medicines to use as needed.  Recommend getting over-the-counter Imodium for treatment of your diarrhea.  Will send you home with medicine for abdominal pain.  Recommend oral hydration via electrolyte rich fluids in the form of Pedialyte, sugar-free Gatorade, liquid IV.  See information attached regarding dietary changes until you are able to tolerate more complex foods.  Recommend close follow-up with your primary care for reassessment of your symptoms.  Please not hesitate to return to the emergency department if the worrisome signs and symptoms we discussed become apparent.

## 2023-11-16 NOTE — ED Notes (Signed)
Cannot discharge, registration in chart.

## 2023-11-16 NOTE — ED Notes (Signed)
Ice chips given to patient per request.

## 2023-12-05 ENCOUNTER — Other Ambulatory Visit (HOSPITAL_BASED_OUTPATIENT_CLINIC_OR_DEPARTMENT_OTHER): Payer: Self-pay

## 2024-01-04 ENCOUNTER — Other Ambulatory Visit (HOSPITAL_BASED_OUTPATIENT_CLINIC_OR_DEPARTMENT_OTHER): Payer: Self-pay

## 2024-01-05 ENCOUNTER — Other Ambulatory Visit (HOSPITAL_BASED_OUTPATIENT_CLINIC_OR_DEPARTMENT_OTHER): Payer: Self-pay

## 2024-01-11 ENCOUNTER — Other Ambulatory Visit (HOSPITAL_BASED_OUTPATIENT_CLINIC_OR_DEPARTMENT_OTHER): Payer: Self-pay

## 2024-01-31 ENCOUNTER — Other Ambulatory Visit (HOSPITAL_BASED_OUTPATIENT_CLINIC_OR_DEPARTMENT_OTHER): Payer: Self-pay

## 2024-01-31 ENCOUNTER — Encounter: Payer: Self-pay | Admitting: Internal Medicine

## 2024-01-31 ENCOUNTER — Ambulatory Visit: Payer: Medicaid Other | Admitting: Internal Medicine

## 2024-01-31 VITALS — BP 122/74 | HR 97 | Temp 98.2°F | Resp 20 | Ht 70.0 in | Wt 238.0 lb

## 2024-01-31 DIAGNOSIS — L858 Other specified epidermal thickening: Secondary | ICD-10-CM

## 2024-01-31 DIAGNOSIS — J302 Other seasonal allergic rhinitis: Secondary | ICD-10-CM | POA: Diagnosis not present

## 2024-01-31 DIAGNOSIS — H1013 Acute atopic conjunctivitis, bilateral: Secondary | ICD-10-CM

## 2024-01-31 DIAGNOSIS — E739 Lactose intolerance, unspecified: Secondary | ICD-10-CM

## 2024-01-31 DIAGNOSIS — J3089 Other allergic rhinitis: Secondary | ICD-10-CM

## 2024-01-31 DIAGNOSIS — T7800XD Anaphylactic reaction due to unspecified food, subsequent encounter: Secondary | ICD-10-CM

## 2024-01-31 DIAGNOSIS — T7800XA Anaphylactic reaction due to unspecified food, initial encounter: Secondary | ICD-10-CM

## 2024-01-31 MED ORDER — DYMISTA 137-50 MCG/ACT NA SUSP
1.0000 | Freq: Two times a day (BID) | NASAL | 5 refills | Status: AC | PRN
  Filled 2024-01-31: qty 23, 30d supply, fill #0

## 2024-01-31 MED ORDER — IPRATROPIUM BROMIDE 0.03 % NA SOLN
2.0000 | Freq: Three times a day (TID) | NASAL | 12 refills | Status: AC | PRN
  Filled 2024-01-31: qty 30, 30d supply, fill #0

## 2024-01-31 MED ORDER — CETIRIZINE HCL 10 MG PO TABS
10.0000 mg | ORAL_TABLET | Freq: Every day | ORAL | 5 refills | Status: AC
  Filled 2024-01-31: qty 32, 32d supply, fill #0
  Filled 2024-05-21: qty 32, 32d supply, fill #1

## 2024-01-31 NOTE — Patient Instructions (Addendum)
 Seasonal and Perennial Allergic: - allergy  testing 05/10/23: positive to grass, weed and tree pollen, indoor mold (penicillium-not penicillin; okay to take penicillins) - intradermals positive to cat, dog and cockroach - Prevention:  - allergen avoidance when possible - consider allergy  shots as long term control of your symptoms by teaching your immune system to be more tolerant of your allergy  triggers - Symptom control: - Continue Dymista  1 spray in each nostril twice daily as needed - Continue Zyrtec  (Cetirizine ) 10mg  daily as needed.  - Start atrovent  (ipatroipum) 1-2 spray twice daily as needed for drainage and can use less frequently if becomes too dry  Allergic Conjunctivitis:  - Continue Allergy  Eye drops-great option includes Zaditor (ketotifen) for eye symptoms daily as needed-can purchase over the counter -Avoid eye drops that say red eye relief as they may contain medications that dry out your eyes.  Food allergy :  - 05/10/23 skin testing was positive to peanut , scallops, oyster, pecan, and pistachio - please strictly avoid peanuts, tree nuts, shellfish, kiwi - for SKIN only reaction, okay to take Benadryl  2 capsules every 4 hours - for SKIN + ANY additional symptoms, OR IF concern for LIFE THREATENING reaction = Epipen  Autoinjector EpiPen  0.3 mg. - If using Epinephrine  autoinjector, call 911 - A food allergy  action plan has been provided and discussed. - Medic Alert identification is recommended.  Lactose Intolerance- Milk allergy  testing 05/10/23 is negative - this is not an allergy  but rather a problem with breaking down dairy (due to an enzyme deficiency) which can lead to bloating, gas, diarrhea, nausea and vomiting - choose lactose free dairy or take lactaid prior to eating dairy products that contain lactose  Rash: Keratosis Pilaris:  - this is a benign condition - use an over-the-counter lotion containing lactic acid or ammonium lactate (such as LacHydrin or  CeraVe S.A.) up to twice daily on bumpy skin - sunscreen using an SPF of 30 or greater is highly encouraged  Nail peeling Discuss with PCP, consider dermatology evaluation.  Follow up :6  months, sooner if needed It was a pleasure seeing you again in clinic today! Thank you for allowing me to participate in your care.  Jonathon Neighbors, MD [facility]  Reducing Pollen Exposure  The American Academy of Allergy , Asthma and Immunology suggests the following steps to reduce your exposure to pollen during allergy  seasons.    Do not hang sheets or clothing out to dry; pollen may collect on these items. Do not mow lawns or spend time around freshly cut grass; mowing stirs up pollen. Keep windows closed at night.  Keep car windows closed while driving. Minimize morning activities outdoors, a time when pollen counts are usually at their highest. Stay indoors as much as possible when pollen counts or humidity is high and on windy days when pollen tends to remain in the air longer. Use air conditioning when possible.  Many air conditioners have filters that trap the pollen spores. Use a HEPA room air filter to remove pollen form the indoor air you breathe. Control of Mold Allergen   Mold and fungi can grow on a variety of surfaces provided certain temperature and moisture conditions exist.  Outdoor molds grow on plants, decaying vegetation and soil.  The major outdoor mold, Alternaria and Cladosporium, are found in very high numbers during hot and dry conditions.  Generally, a late Summer - Fall peak is seen for common outdoor fungal spores.  Rain will temporarily lower outdoor mold  spore count, but counts rise rapidly when the rainy period ends.  The most important indoor molds are Aspergillus and Penicillium.  Dark, humid and poorly ventilated basements are ideal sites for mold growth.  The next most common sites of mold growth are the bathroom and the kitchen.  Outdoor (Seasonal)  Mold Control  Use air conditioning and keep windows closed Avoid exposure to decaying vegetation. Avoid leaf raking. Avoid grain handling. Consider wearing a face mask if working in moldy areas.    Indoor (Perennial) Mold Control   Maintain humidity below 50%. Clean washable surfaces with 5% bleach solution. Remove sources e.g. contaminated carpets.  Control of Dog or Cat Allergen  Avoidance is the best way to manage a dog or cat allergy . If you have a dog or cat and are allergic to dog or cats, consider removing the dog or cat from the home. If you have a dog or cat but don't want to find it a new home, or if your family wants a pet even though someone in the household is allergic, here are some strategies that may help keep symptoms at bay:  Keep the pet out of your bedroom and restrict it to only a few rooms. Be advised that keeping the dog or cat in only one room will not limit the allergens to that room. Don't pet, hug or kiss the dog or cat; if you do, wash your hands with soap and water. High-efficiency particulate air (HEPA) cleaners run continuously in a bedroom or living room can reduce allergen levels over time. Regular use of a high-efficiency vacuum cleaner or a central vacuum can reduce allergen levels. Giving your dog or cat a bath at least once a week can reduce airborne allergen. Control of Cockroach Allergen  Cockroach allergen has been identified as an important cause of acute attacks of asthma, especially in urban settings.  There are fifty-five species of cockroach that exist in the [place] , however only three, the [place], [place] and [place] species produce allergen that can affect patients with Asthma.  Allergens can be obtained from fecal particles, egg casings and secretions from cockroaches.    Remove food sources. Reduce access to water. Seal access and entry points. Spray runways with 0.5-1% Diazinon or Chlorpyrifos Blow boric acid power under  stoves and refrigerator. Place bait stations (hydramethylnon) at feeding sites.

## 2024-01-31 NOTE — Progress Notes (Signed)
 FOLLOW UP Date of Service/Encounter:  01/31/24  Subjective:  Emily Villanueva (DOB: 03/06/1996) is a 28 y.o. female who [facility] in re-evaluation of the following: allergic rhinitis, food allergies and keratosis pilaris History obtained from: chart review and patient.  For Review, LV was on 11/03/23  with Dr.Toluwani Yadav seen for routine follow-up. See below for summary of history and diagnostics.   Summary of prior visit: Recent ingestion of shrimp followed by delayed facial flushing and respiratory symptoms. Unclear if symptoms were due to shellfish allergy  or concurrent viral illness. However, advised continuing current prednisone  course as prescribed by UC. Ongoing symptoms thought more likely related to viral illness and not multi-day allergic reaction. Symptoms of cough, sore throat, and general malaise. Likely viral in nature given the context. Rash on arms consistent with keratosis pilaris and unrelated to current symptoms. Discussed stopping montelukast . Breathing test was normal despite complaints of trouble breathing, suspected related to her current viral illness. No prior history of asthma. ----------------------------------------------------- Pertinent History/Diagnostics:  Allergic Rhinitis:  nasal congestion, rhinorrhea, watery eyes, and itchy eyes,post nasal drip, sneezing attacks Occurs year-round - SPT environmental panel (05/10/23):  positive to grass, weed and tree pollen, indoor mold (penicillium-not penicillin; okay to take penicillins) - intradermals positive to cat, dog and cockroach Food Allergy :  Kiwi: feels like acid swishing in her mouth Dairy: extreme abdominal pain, but tolerates lactose free dairy. Almond: throat itchy/scratchy, hoarseness, told her throat was closing at an UC Peanuts: ate reese's cup years ago and felt like her throat was closing. Shellfish: rash, no other symptoms except maybe abdominal pain. Avoids  fish due to reaction with shellfish. She does report a recent exposure to tree nuts in an ice cream and developed immediate throat and mouth itching. Resolved with benadryl  PRN.  Reviewed previous allergy  appointment with Dr. Doree Games ([facility]): seen 2021 for concern for food allergies-reported upset stomach and rashes, food intolerance suspected and labs ordered  05/12/20: sIgE: positive to walnut 0.74, pistachio 0.82, pecan 0.11, almonds 0.81, [place] nut 0.39, hazelnut 0.69, peanut  0.80, cashew < 0.10 Shellfish clams 0.36, crab 0.45, lobster 0.26, oyster 0.29 shrimp 0.20, scallop Patient told testing was negative.  - 05/10/23 skin testing was positive to peanut , scallops, oyster, pecan, and pistachio - labs 05/10/23: low level positive to shellfish-challenge offered, positive to peanuts and tree nuts, negative to fish. Advised to avoid nuts, shellfish, okay to introduce fish if desire. --------------------------------------------------- Today presents for follow-up. Discussed the use of AI scribe software for clinical note transcription with the patient, who gave verbal consent to proceed.  History of Present Illness   Emily Villanueva is a 28 year old female who presents with sinus drainage and skin issues.  She has been experiencing significant sinus drainage leading to a burning sensation in the back of her throat for about three to four days, possibly up to a week. This began just after the pollen began to dissipate. She has been using Dymista  nasal spray once daily and Zyrtec  for her allergies. She attempted to use Nyquil, Alka-Seltzer, and Mucinex without relief.  She also reports issues with her skin and nails, which typically occur around the same time as her sinus problems. Her nails are 'layering off' and she experiences cracks that lead to the top layer peeling off. Her skin has been breaking out in red bumps, which she noticed due to mild itching. These symptoms were particularly  severe a day or two ago but have since  calmed down.  Regarding food allergies, she mentions a slight aggravation towards avocados, causing a 'back of the mouth' throat burn. She has been consuming avocados frequently, which she suspects might be contributing to the irritation.  She is concerned about her allergies being exacerbated by her dogs, who are covered in slobber. She plans to wash them despite the potential for her skin to react negatively, describing her skin's reaction to allergens as feeling like 'acid'. She is considering taking extra antihistamines, such as Zyrtec , to manage her symptoms during this task.   She is interested in [facility].    Chart review:  ER visit 12/26/23 for generalized abdominal pain started on prilosec and given zofran . Planning for outpatient US . ED visit 11/16/23: n/v/d with exposure to norovirus, CT image abdomen normal, tx: IV fluids  All medications reviewed by clinical staff and updated in chart. No new pertinent medical or surgical history except as noted in HPI.  ROS: All others negative except as noted per HPI.   Objective:  BP 122/74 (BP Location: Right Arm, Patient Position: Sitting)   Pulse 97   Temp 98.2 F (36.8 C) (Oral)   Resp 20   Ht 5\' 10"  (1.778 m)   Wt 238 lb (108 kg)   SpO2 97%   BMI 34.15 kg/m  Body mass index is 34.15 kg/m. Physical Exam: General Appearance:  Alert, cooperative, no distress, appears stated age  Head:  Normocephalic, without obvious abnormality, atraumatic  Eyes:  Conjunctiva clear, EOM's intact  Ears EACs normal bilaterally and normal TMs bilaterally  Nose: Nares normal, hypertrophic turbinates, normal mucosa, and no visible anterior polyps  Throat: Lips, tongue normal; teeth and gums normal, normal posterior oropharynx  Neck: Supple, symmetrical  Lungs:   clear to auscultation bilaterally, Respirations unlabored, no coughing  Heart:  regular rate and rhythm and no murmur, Appears well perfused  Extremities:  No edema  Skin: Erythematous pinpoint papules scattered on proximal bilateral upper extremities  Neurologic: No gross deficits   Labs:  Lab Orders  No laboratory test(s) ordered today     Assessment/Plan   Seasonal and Perennial Allergic: with allergic flare - allergy  testing 05/10/23: positive to grass, weed and tree pollen, indoor mold (penicillium-not penicillin; okay to take penicillins) - intradermals positive to cat, dog and cockroach - Prevention:  - allergen avoidance when possible - consider allergy  shots as long term control of your symptoms by teaching your immune system to be more tolerant of your allergy  triggers - Symptom control: - Continue Dymista  1 spray in each nostril twice daily as needed - Continue Zyrtec  (Cetirizine ) 10mg  daily as needed.  - Start atrovent  (ipatroipum) 1-2 spray twice daily as needed for drainage and can use less frequently if becomes too dry  Allergic Conjunctivitis:  - Continue Allergy  Eye drops-great option includes Zaditor (ketotifen) for eye symptoms daily as needed-can purchase over the counter -Avoid eye drops that say red eye relief as they may contain medications that dry out your eyes.  Food allergy : stable - 05/10/23 skin testing was positive to peanut , scallops, oyster, pecan, and pistachio - please strictly avoid peanuts, tree nuts, shellfish, kiwi - for SKIN only reaction, okay to take Benadryl  2 capsules every 4 hours - for SKIN + ANY additional symptoms, OR IF concern for LIFE THREATENING reaction = Epipen  Autoinjector EpiPen  0.3 mg. - If using Epinephrine  autoinjector, call 911 - A food allergy  action plan has been provided and discussed. - Medic Alert identification is recommended.  Lactose Intolerance-stable  Milk allergy  testing 05/10/23 is negative - this is not an allergy  but rather a problem with breaking down dairy (due to an enzyme deficiency) which can lead to bloating, gas, diarrhea, nausea and vomiting - choose  lactose free dairy or take lactaid prior to eating dairy products that contain lactose  Rash: Keratosis Pilaris: with current flare. - this is a benign condition - use an over-the-counter lotion containing lactic acid or ammonium lactate (such as LacHydrin or CeraVe S.A.) up to twice daily on bumpy skin - sunscreen using an SPF of 30 or greater is highly encouraged  Nail peeling Discuss with PCP, consider dermatology evaluation.  Follow up :6  months, sooner if needed It was a pleasure seeing you again in clinic today! Thank you for allowing me to participate in your care.  Other: none  Jonathon Neighbors, MD  [facility] 

## 2024-02-01 ENCOUNTER — Other Ambulatory Visit (HOSPITAL_BASED_OUTPATIENT_CLINIC_OR_DEPARTMENT_OTHER): Payer: Self-pay

## 2024-02-08 ENCOUNTER — Other Ambulatory Visit (HOSPITAL_BASED_OUTPATIENT_CLINIC_OR_DEPARTMENT_OTHER): Payer: Self-pay

## 2024-02-08 MED ORDER — HYDROCORTISONE (PERIANAL) 2.5 % EX CREA
1.0000 | TOPICAL_CREAM | Freq: Two times a day (BID) | CUTANEOUS | 0 refills | Status: AC | PRN
Start: 2024-02-08 — End: ?
  Filled 2024-02-08: qty 30, 10d supply, fill #0

## 2024-03-31 ENCOUNTER — Emergency Department (HOSPITAL_BASED_OUTPATIENT_CLINIC_OR_DEPARTMENT_OTHER)

## 2024-03-31 ENCOUNTER — Other Ambulatory Visit: Payer: Self-pay

## 2024-03-31 ENCOUNTER — Encounter (HOSPITAL_BASED_OUTPATIENT_CLINIC_OR_DEPARTMENT_OTHER): Payer: Self-pay | Admitting: Emergency Medicine

## 2024-03-31 ENCOUNTER — Emergency Department (HOSPITAL_BASED_OUTPATIENT_CLINIC_OR_DEPARTMENT_OTHER)
Admission: EM | Admit: 2024-03-31 | Discharge: 2024-04-01 | Disposition: A | Discharge status: Home / Self Care | Attending: Emergency Medicine | Admitting: Emergency Medicine

## 2024-03-31 DIAGNOSIS — Z91010 Allergy to peanuts: Secondary | ICD-10-CM | POA: Diagnosis not present

## 2024-03-31 DIAGNOSIS — R1084 Generalized abdominal pain: Secondary | ICD-10-CM | POA: Diagnosis present

## 2024-03-31 DIAGNOSIS — E878 Other disorders of electrolyte and fluid balance, not elsewhere classified: Secondary | ICD-10-CM | POA: Insufficient documentation

## 2024-03-31 DIAGNOSIS — K529 Noninfective gastroenteritis and colitis, unspecified: Secondary | ICD-10-CM | POA: Diagnosis not present

## 2024-03-31 LAB — HCG, SERUM, QUALITATIVE: Preg, Serum: NEGATIVE

## 2024-03-31 LAB — CBC WITH DIFFERENTIAL/PLATELET
Abs Immature Granulocytes: 0.04 10*3/uL (ref 0.00–0.07)
Basophils Absolute: 0 10*3/uL (ref 0.0–0.1)
Basophils Relative: 0 %
Eosinophils Absolute: 0.1 10*3/uL (ref 0.0–0.5)
Eosinophils Relative: 1 %
HCT: 43.8 % (ref 36.0–46.0)
Hemoglobin: 15.1 g/dL — ABNORMAL HIGH (ref 12.0–15.0)
Immature Granulocytes: 0 %
Lymphocytes Relative: 12 %
Lymphs Abs: 1.1 10*3/uL (ref 0.7–4.0)
MCH: 31.1 pg (ref 26.0–34.0)
MCHC: 34.5 g/dL (ref 30.0–36.0)
MCV: 90.3 fL (ref 80.0–100.0)
Monocytes Absolute: 0.6 10*3/uL (ref 0.1–1.0)
Monocytes Relative: 6 %
Neutro Abs: 7.5 10*3/uL (ref 1.7–7.7)
Neutrophils Relative %: 81 %
Platelets: 209 10*3/uL (ref 150–400)
RBC: 4.85 MIL/uL (ref 3.87–5.11)
RDW: 12 % (ref 11.5–15.5)
WBC: 9.4 10*3/uL (ref 4.0–10.5)
nRBC: 0 % (ref 0.0–0.2)

## 2024-03-31 LAB — URINALYSIS, ROUTINE W REFLEX MICROSCOPIC
Bilirubin Urine: NEGATIVE
Glucose, UA: NEGATIVE mg/dL
Hgb urine dipstick: NEGATIVE
Ketones, ur: NEGATIVE mg/dL
Leukocytes,Ua: NEGATIVE
Nitrite: NEGATIVE
Protein, ur: 30 mg/dL — AB
Specific Gravity, Urine: >=1.03 (ref 1.005–1.030)
pH: 6 (ref 5.0–8.0)

## 2024-03-31 LAB — URINALYSIS, MICROSCOPIC (REFLEX)

## 2024-03-31 LAB — COMPREHENSIVE METABOLIC PANEL WITH GFR
ALT: 20 U/L (ref 0–44)
AST: 22 U/L (ref 15–41)
Albumin: 4.6 g/dL (ref 3.5–5.0)
Alkaline Phosphatase: 73 U/L (ref 38–126)
Anion gap: 16 — ABNORMAL HIGH (ref 5–15)
BUN: 15 mg/dL (ref 6–20)
CO2: 19 mmol/L — ABNORMAL LOW (ref 22–32)
Calcium: 9.4 mg/dL (ref 8.9–10.3)
Chloride: 104 mmol/L (ref 98–111)
Creatinine, Ser: 0.83 mg/dL (ref 0.44–1.00)
GFR, Estimated: >60 mL/min (ref >60)
Glucose, Bld: 92 mg/dL (ref 70–99)
Potassium: 3.4 mmol/L — ABNORMAL LOW (ref 3.5–5.1)
Sodium: 139 mmol/L (ref 135–145)
Total Bilirubin: 1 mg/dL (ref 0.0–1.2)
Total Protein: 6.8 g/dL (ref 6.5–8.1)

## 2024-03-31 LAB — LIPASE, BLOOD: Lipase: 35 U/L (ref 11–51)

## 2024-03-31 MED ORDER — MORPHINE SULFATE (PF) 4 MG/ML IV SOLN
4.0000 mg | Freq: Once | INTRAVENOUS | Status: AC
  Administered 2024-03-31: 4 mg via INTRAVENOUS
  Filled 2024-03-31: qty 1

## 2024-03-31 MED ORDER — IOHEXOL 300 MG/ML  SOLN
100.0000 mL | Freq: Once | INTRAMUSCULAR | Status: AC | PRN
  Administered 2024-03-31: 100 mL via INTRAVENOUS

## 2024-03-31 MED ORDER — ONDANSETRON HCL 4 MG/2ML IJ SOLN
4.0000 mg | Freq: Once | INTRAMUSCULAR | Status: AC
  Administered 2024-03-31: 4 mg via INTRAVENOUS
  Filled 2024-03-31: qty 2

## 2024-03-31 MED ORDER — FENTANYL CITRATE PF 50 MCG/ML IJ SOSY
50.0000 ug | PREFILLED_SYRINGE | Freq: Once | INTRAMUSCULAR | Status: AC
  Administered 2024-03-31: 50 ug via INTRAVENOUS
  Filled 2024-03-31: qty 1

## 2024-03-31 MED ORDER — OXYCODONE HCL 5 MG PO TABS: 5.0000 mg | ORAL_TABLET | ORAL | 0 refills | Status: AC | PRN

## 2024-03-31 MED ORDER — DICYCLOMINE HCL 20 MG PO TABS: 20.0000 mg | ORAL_TABLET | Freq: Two times a day (BID) | ORAL | 0 refills | Status: AC

## 2024-03-31 MED ORDER — LACTATED RINGERS IV BOLUS
2000.0000 mL | Freq: Once | INTRAVENOUS | Status: AC
Start: 2024-03-31 — End: 2024-04-01
  Administered 2024-03-31: 2000 mL via INTRAVENOUS

## 2024-03-31 MED ORDER — ONDANSETRON HCL 4 MG PO TABS: 4.0000 mg | ORAL_TABLET | Freq: Four times a day (QID) | ORAL | 0 refills | Status: AC

## 2024-03-31 NOTE — ED Provider Notes (Signed)
 [place] [facility] Provider Note   CSN: [id number] Arrival date & time: 03/31/24  2102     Patient presents with: Abdominal Pain   Emily Villanueva is a 28 y.o. female.   28 year old female presents today for concern of nausea, vomiting, diarrhea that started around 5 AM.  Also endorses generalized abdominal pain.  History of similar symptoms back in March and February 2025.  States she is undergoing outpatient workup with gastroenterology.  Has a colonoscopy coming up this upcoming week.  No p.o. intake because of this today.  States her emesis has transitioned to dry heaving.  Denies any hematemesis, or blood in stool.  She believes that she was told she had a gallbladder issue.  Has not seen a surgeon but sees a gastroenterologist.  He is waiting to undergo additional testing.   The history is provided by the patient. No language interpreter was used.       Prior to Admission medications   Medication Sig Start Date End Date Taking? Authorizing Provider  acetaminophen  (TYLENOL  8 HOUR) 650 MG CR tablet Take 1 tablet (650 mg total) by mouth every 8 (eight) hours as needed for pain or fever. 11/16/23   Silver Wonda LABOR, PA  busPIRone  (BUSPAR ) 10 MG tablet Take 1 tablet (10 mg total) by mouth 3 (three) times daily. Patient not taking: Reported on 01/31/2024 10/13/23     cariprazine  (VRAYLAR ) 1.5 MG capsule Take 1 capsule (1.5 mg total) by mouth daily. Patient not taking: Reported on 01/31/2024 10/13/23     cetirizine  (ZYRTEC  ALLERGY ) 10 MG tablet Take 1 tablet (10 mg total) by mouth daily. 01/31/24   Marinda Rocky SAILOR, MD  dicyclomine  (BENTYL ) 20 MG tablet Take 1 tablet (20 mg total) by mouth 2 (two) times daily. 11/16/23   Silver Wonda LABOR, PA  DYMISTA  137-50 MCG/ACT SUSP Place 1 spray into both nostrils 2 (two) times daily as needed. 01/31/24   Marinda Rocky SAILOR, MD  EPINEPHrine  (EPIPEN  2-PAK) 0.3 mg/0.3 mL IJ SOAJ injection Inject 0.3 mg into the muscle as  needed for anaphylaxis. 05/10/23   Marinda Rocky SAILOR, MD  ergocalciferol  (VITAMIN D2) 1.25 MG (50000 UT) capsule Take 1 capsule (50,000 Units total) by mouth once a week. Patient not taking: Reported on 01/31/2024 06/06/23     hydrocortisone  (ANUSOL -HC) 2.5 % rectal cream Apply rectally 1 to 2 times per day as needed for pain. 02/08/24     ipratropium (ATROVENT ) 0.03 % nasal spray Place 2 sprays into both nostrils 3 (three) times daily as needed for rhinitis. 01/31/24   Marinda Rocky SAILOR, MD  levonorgestrel  (MIRENA ) 20 MCG/24HR IUD 1 each by Intrauterine route once. Patient not taking: Reported on 01/31/2024    [provider]  ondansetron  (ZOFRAN ) 4 MG tablet Take 1 tablet (4 mg total) by mouth every 8 (eight) hours as needed for nausea/vomitng. 08/08/23     famotidine  (PEPCID ) 40 MG tablet Take 1 tablet (40 mg total) by mouth 2 (two) times daily for 10 days. 03/16/23 04/02/23    fexofenadine  (ALLEGRA ) 60 MG tablet Take 1 tablet (60 mg total) by mouth 3 (three) times daily. 03/16/23 04/02/23      Allergies: Kiwi extract, Lactose intolerance (gi), Other, Peanuts [peanut  oil], and Shrimp [shellfish allergy ]    Review of Systems  Constitutional:  Negative for chills and fever.  Gastrointestinal:  Positive for abdominal pain, diarrhea, nausea and vomiting.  Genitourinary:  Negative for dysuria.  Neurological:  Negative for light-headedness.  All other systems reviewed and are negative.   Updated Vital Signs BP (!) 167/140 (BP Location: Left Arm)   Pulse 78   Temp 97.8 F (36.6 C)   Resp 20   Ht 5' 10 (1.778 m)   Wt 108 kg   SpO2 100%   Breastfeeding No   BMI 34.16 kg/m   Physical Exam Vitals and nursing note reviewed.  Constitutional:      General: She is not in acute distress.    Appearance: Normal appearance. She is not ill-appearing.  HENT:     Head: Normocephalic and atraumatic.     Nose: Nose normal.   Eyes:     Conjunctiva/sclera: Conjunctivae normal.    Cardiovascular:      Rate and Rhythm: Normal rate and regular rhythm.  Pulmonary:     Effort: Pulmonary effort is normal. No respiratory distress.  Abdominal:     General: There is no distension.     Palpations: Abdomen is soft.     Tenderness: There is abdominal tenderness. There is no right CVA tenderness, left CVA tenderness or guarding.   Musculoskeletal:        General: No deformity. Normal range of motion.     Cervical back: Normal range of motion.   Skin:    Findings: No rash.   Neurological:     Mental Status: She is alert.     (all labs ordered are listed, but only abnormal results are displayed) Labs Reviewed  CBC WITH DIFFERENTIAL/PLATELET - Abnormal; Notable for the following components:      Result Value   Hemoglobin 15.1 (*)    All other components within normal limits  COMPREHENSIVE METABOLIC PANEL WITH GFR - Abnormal; Notable for the following components:   Potassium 3.4 (*)    CO2 19 (*)    Anion gap 16 (*)    All other components within normal limits  URINALYSIS, ROUTINE W REFLEX MICROSCOPIC - Abnormal; Notable for the following components:   Color, Urine AMBER (*)    Protein, ur 30 (*)    All other components within normal limits  URINALYSIS, MICROSCOPIC (REFLEX) - Abnormal; Notable for the following components:   Bacteria, UA RARE (*)    All other components within normal limits  HCG, SERUM, QUALITATIVE  LIPASE, BLOOD    EKG: None  Radiology: No results found.   Procedures   Medications Ordered in the ED  morphine  (PF) 4 MG/ML injection 4 mg (4 mg Intravenous Given 03/31/24 2149)  lactated ringers  bolus 2,000 mL (2,000 mLs Intravenous New Bag/Given 03/31/24 2157)  ondansetron  (ZOFRAN ) injection 4 mg (4 mg Intravenous Given 03/31/24 2149)  iohexol  (OMNIPAQUE ) 300 MG/ML solution 100 mL (100 mLs Intravenous Contrast Given 03/31/24 2310)    Clinical Course as of 03/31/24 2340  Sun Mar 31, 2024  2324 CT without acute intra-abdominal process. Reevaluation she is  reporting significant pain in the left side of her abdomen.  Pain seems to be migratory and intermittent.  Patient just received her dose of fentanyl .  Will p.o. challenge and reevaluate.SABRA REINS    Clinical Course User Index [AA] Hildegard Loge, PA-C                                 Medical Decision Making Amount and/or Complexity of Data Reviewed Labs: ordered. Radiology: ordered.  Risk Prescription drug management.   Medical Decision Making / ED Course   This patient presents  to the ED for concern of abdominal pain, nausea, vomiting, diarrhea, this involves an extensive number of treatment options, and is a complaint that carries with it a high risk of complications and morbidity.  The differential diagnosis includes gastroenteritis, cholecystitis, pancreatitis, colitis  MDM: 28 year old female presents with symptoms mentioned above. Overall appears well.  Hemodynamically stable.  Is somewhat hypertensive. CBC without leukocytosis, no anemia.  CMP does show some anion gap of 16, and slightly low bicarb otherwise without acute concern.  UA without evidence of UTI.  Pregnancy test negative.  Lipase within normal.  Will obtain CT.  Will provide fluids and reevaluate. She presents with her neighbor.  At the end of my shift patient signed out to oncoming provider.  She just received her second pain medicine dose.  Will require reevaluation.    Lab Tests: -I ordered, reviewed, and interpreted labs.   The pertinent results include:   Labs Reviewed  CBC WITH DIFFERENTIAL/PLATELET - Abnormal; Notable for the following components:      Result Value   Hemoglobin 15.1 (*)    All other components within normal limits  COMPREHENSIVE METABOLIC PANEL WITH GFR - Abnormal; Notable for the following components:   Potassium 3.4 (*)    CO2 19 (*)    Anion gap 16 (*)    All other components within normal limits  URINALYSIS, ROUTINE W REFLEX MICROSCOPIC - Abnormal; Notable for the following  components:   Color, Urine AMBER (*)    Protein, ur 30 (*)    All other components within normal limits  URINALYSIS, MICROSCOPIC (REFLEX) - Abnormal; Notable for the following components:   Bacteria, UA RARE (*)    All other components within normal limits  HCG, SERUM, QUALITATIVE  LIPASE, BLOOD      EKG  EKG Interpretation Date/Time:    Ventricular Rate:    PR Interval:    QRS Duration:    QT Interval:    QTC Calculation:   R Axis:      Text Interpretation:           Imaging Studies ordered: I ordered imaging studies including CT abdomen pelvis.  Contrast I independently visualized and interpreted imaging. I agree with the radiologist interpretation   Medicines ordered and prescription drug management: Meds ordered this encounter  Medications   morphine  (PF) 4 MG/ML injection 4 mg   lactated ringers  bolus 2,000 mL   ondansetron  (ZOFRAN ) injection 4 mg   iohexol  (OMNIPAQUE ) 300 MG/ML solution 100 mL    -I have reviewed the patients home medicines and have made adjustments as needed     Reevaluation: After the interventions noted above, I reevaluated the patient and found that they have :stayed the same  Co morbidities that complicate the patient evaluation  Past Medical History:  Diagnosis Date   Anxiety and depression    Depression, major, recurrent (HCC) 10/19/2015   Started on Zoloft  10/19/2015    Family history of movement disorder 09/21/2015   Patient's mother and maternal uncle have progressive movement disorder    Scoliosis       Dispostion: Signed out to oncoming provider.    Final diagnoses:  Gastroenteritis    ED Discharge Orders          Ordered    dicyclomine  (BENTYL ) 20 MG tablet  2 times daily        03/31/24 2337    ondansetron  (ZOFRAN ) 4 MG tablet  Every 6 hours        03/31/24  2337    oxyCODONE  (ROXICODONE ) 5 MG immediate release tablet  Every 4 hours PRN        03/31/24 2337               Hildegard Loge,  PA-C 03/31/24 2340    Long, Fonda MATSU, MD 04/01/24 [id number]

## 2024-03-31 NOTE — Discharge Instructions (Signed)
 Your workup was reassuring.  CT scan and blood work was all reassuring.  Your symptoms improved after medicines in the emergency department.  Follow-up with your gastroenterologist.  Return for any emergent symptoms.

## 2024-03-31 NOTE — ED Triage Notes (Signed)
 Pt c/o nausea and diarrhea that started around 0500; started vomiting later; c/o generalized lower abd pain

## 2024-04-01 NOTE — ED Provider Notes (Signed)
 Care of the patient assumed at shift change pending re-evaluation after additional pain meds. Here for abdominal pain/vomiting. ED workup unremarkable. Already scheduled to see GI/colonoscopy. She is feeling better now and ready for discharge.    Roselyn Carlin NOVAK, MD 04/01/24 BEATRIS

## 2024-04-03 ENCOUNTER — Other Ambulatory Visit (HOSPITAL_BASED_OUTPATIENT_CLINIC_OR_DEPARTMENT_OTHER): Payer: Self-pay

## 2024-04-03 MED ORDER — GAVILYTE-C 240 G PO SOLR
ORAL | 0 refills | Status: AC
  Filled 2024-04-03: qty 4000, 2d supply, fill #0

## 2024-04-30 ENCOUNTER — Other Ambulatory Visit: Payer: Self-pay

## 2024-04-30 ENCOUNTER — Emergency Department (HOSPITAL_BASED_OUTPATIENT_CLINIC_OR_DEPARTMENT_OTHER)
Admission: EM | Admit: 2024-04-30 | Discharge: 2024-04-30 | Discharge status: Absent without leave | Source: Home / Self Care

## 2024-05-21 ENCOUNTER — Other Ambulatory Visit: Payer: Self-pay

## 2024-05-21 ENCOUNTER — Other Ambulatory Visit (HOSPITAL_BASED_OUTPATIENT_CLINIC_OR_DEPARTMENT_OTHER): Payer: Self-pay

## 2024-05-21 MED ORDER — NEOMYCIN-POLYMYXIN-HC 3.5-10000-1 OT SUSP
4.0000 [drp] | Freq: Three times a day (TID) | OTIC | 0 refills | Status: AC
  Filled 2024-05-21: qty 10, 17d supply, fill #0

## 2024-05-30 ENCOUNTER — Other Ambulatory Visit (HOSPITAL_BASED_OUTPATIENT_CLINIC_OR_DEPARTMENT_OTHER): Payer: Self-pay

## 2024-05-30 MED ORDER — DICYCLOMINE HCL 20 MG PO TABS
20.0000 mg | ORAL_TABLET | Freq: Three times a day (TID) | ORAL | 1 refills | Status: AC | PRN
  Filled 2024-05-30: qty 90, 30d supply, fill #0

## 2024-06-18 ENCOUNTER — Other Ambulatory Visit (HOSPITAL_BASED_OUTPATIENT_CLINIC_OR_DEPARTMENT_OTHER): Payer: Self-pay

## 2024-06-18 MED ORDER — CETIRIZINE HCL 10 MG PO TABS
10.0000 mg | ORAL_TABLET | Freq: Every day | ORAL | 1 refills | Status: DC
  Filled 2024-06-18: qty 30, 30d supply, fill #0

## 2024-06-19 ENCOUNTER — Telehealth: Payer: Self-pay | Admitting: *Deleted

## 2024-06-19 NOTE — Telephone Encounter (Signed)
 Can we get a copy of her report? Is she following with GI?

## 2024-06-19 NOTE — Telephone Encounter (Signed)
 Pt called and stated she had a coloscopy and biopsy about a month ago and she was told she had mast cells in her colon. I did schedule her for a 6 month follow up 10/7 with Dr Marinda.

## 2024-07-04 ENCOUNTER — Ambulatory Visit: Admitting: Family Medicine

## 2024-07-04 ENCOUNTER — Other Ambulatory Visit (HOSPITAL_COMMUNITY)
Admission: RE | Admit: 2024-07-04 | Discharge: 2024-07-04 | Disposition: A | Source: Ambulatory Visit | Attending: Family Medicine | Admitting: Family Medicine

## 2024-07-04 VITALS — BP 111/71 | HR 79 | Ht 70.0 in | Wt 222.1 lb

## 2024-07-04 DIAGNOSIS — R42 Dizziness and giddiness: Secondary | ICD-10-CM | POA: Diagnosis not present

## 2024-07-04 DIAGNOSIS — Z30433 Encounter for removal and reinsertion of intrauterine contraceptive device: Secondary | ICD-10-CM | POA: Diagnosis not present

## 2024-07-04 DIAGNOSIS — Z01419 Encounter for gynecological examination (general) (routine) without abnormal findings: Secondary | ICD-10-CM | POA: Diagnosis present

## 2024-07-04 DIAGNOSIS — Z30432 Encounter for removal of intrauterine contraceptive device: Secondary | ICD-10-CM

## 2024-07-04 DIAGNOSIS — Z30430 Encounter for insertion of intrauterine contraceptive device: Secondary | ICD-10-CM

## 2024-07-04 MED ORDER — LEVONORGESTREL 20 MCG/DAY IU IUD
1.0000 | INTRAUTERINE_SYSTEM | Freq: Once | INTRAUTERINE | Status: AC
  Administered 2024-07-04: 1 via INTRAUTERINE

## 2024-07-04 NOTE — Progress Notes (Unsigned)
 Patient presents for Annual.  LMP: No LMP recorded. (Menstrual status: IUD).  Last pap: Date: 10/12/2022 Contraception: 07/24/2017 Mammogram: Not yet indicated STD Screening: Declines Flu Vaccine : Declines  CC:   Fun Fact:

## 2024-07-04 NOTE — Progress Notes (Unsigned)
 ANNUAL EXAM Patient name: Emily Villanueva MRN [id number]  Date of birth: 1996-01-07 Chief Complaint:   Annual Exam  History of Present Illness:   Emily Villanueva is a 28 y.o.  [id number]  female  being seen today for a routine annual exam.  Current complaints: having some dizziness occasionally  No LMP recorded. (Menstrual status: IUD).    Last pap 10/2022. Results were: NILM w/ HRHPV negative. H/O abnormal pap: no Last mammogram:      07/04/2024   10:22 AM 09/18/2015    2:09 PM  Depression screen PHQ 2/9  Decreased Interest 0 0  Down, Depressed, Hopeless 1 0  PHQ - 2 Score 1 0  Altered sleeping 3   Tired, decreased energy 0   Change in appetite 1   Feeling bad or failure about yourself  0   Trouble concentrating 2   Moving slowly or fidgety/restless 1   Suicidal thoughts 0   PHQ-9 Score 8         07/04/2024   10:22 AM  GAD 7 : Generalized Anxiety Score  Nervous, Anxious, on Edge 0  Control/stop worrying 1  Worry too much - different things 0  Trouble relaxing 2  Restless 0  Easily annoyed or irritable 1  Afraid - awful might happen 3  Total GAD 7 Score 7     Review of Systems:   Pertinent items are noted in HPI Denies any headaches, blurred vision, fatigue, shortness of breath, chest pain, abdominal pain, abnormal vaginal discharge/itching/odor/irritation, problems with periods, bowel movements, urination, or intercourse unless otherwise stated above. Pertinent History Reviewed:  Reviewed past medical,surgical, social and family history.  Reviewed problem list, medications and allergies. Physical Assessment:   Vitals:   07/04/24 1016  BP: 111/71  Pulse: 79  Weight: 222 lb 1.3 oz (100.7 kg)  Height: 5' 10 (1.778 m)  Body mass index is 31.87 kg/m.        Physical Examination:   General appearance - well appearing, and in no distress  Mental status - alert, oriented to person, place, and time  Psych:  She has a normal mood and affect  Skin - warm and dry,  normal color, no suspicious lesions noted  Chest - effort normal, all lung fields clear to auscultation bilaterally  Heart - normal rate and regular rhythm  Neck:  midline trachea, no thyromegaly or nodules  Breasts - breasts appear normal, no suspicious masses, no skin or nipple changes or axillary nodes  Abdomen - soft, nontender, nondistended, no masses or organomegaly  Pelvic - VULVA: normal appearing vulva with no masses, tenderness or lesions  VAGINA: normal appearing vagina with normal color and discharge, no lesions  CERVIX: normal appearing cervix without discharge or lesions, no CMT  Thin prep pap is done with HR HPV cotesting  UTERUS: uterus is felt to be normal size, shape, consistency and nontender   ADNEXA: No adnexal masses or tenderness noted.  Extremities:  No swelling or varicosities noted  Chaperone present for exam  IUD Removal  Patient was in the dorsal lithotomy position, normal external genitalia was noted.  A speculum was placed in the patient's vagina, normal discharge was noted, no lesions. The multiparous cervix was visualized, no lesions, no abnormal discharge,  and was swabbed with Betadine using scopettes.  The strings of the IUD was grasped and pulled using ring forceps.  The IUD was successfully removed in its entirety.   IUD Procedure Note Patient identified, informed consent performed, signed copy  in chart, time out was performed.  Urine pregnancy test negative.  Speculum placed in the vagina.  Cervix visualized.  Cleaned with Betadine x 2.  Paracervical block placed with Lidocaine  2% with epinephrine  10mL spread between the 12 o'clock, 4 o'clock, 8 o'clock positions. Cervix grasped anteriorly with a single tooth tenaculum.  Uterus sounded to 8 cm.  Mirena   IUD placed per manufacturer's recommendations.  Strings trimmed to 3 cm. Tenaculum was removed, good hemostasis noted.  Patient tolerated procedure well.   Patient given post procedure instructions and  Mirena  care card with expiration date.  Patient is asked to check IUD strings periodically and follow up in 4-6 weeks for IUD check.  Assessment & Plan:  1. Well woman exam with routine gynecological exam (Primary) - TSH Rfx on Abnormal to Free T4 - CBC - Iron, TIBC and Ferritin Panel - Comp Met (CMET)  2. Encounter for IUD removal and reinsertion   3. Dizziness Check labs - TSH Rfx on Abnormal to Free T4 - CBC - Iron, TIBC and Ferritin Panel - Comp Met (CMET)   Labs/procedures today:   Orders Placed This Encounter  Procedures   TSH Rfx on Abnormal to Free T4   CBC   Iron, TIBC and Ferritin Panel   Comp Met (CMET)    Meds: No orders of the defined types were placed in this encounter.   Follow-up: No follow-ups on file.  Colden Samaras J Aiven Kampe, DO 07/04/2024 10:48 AM

## 2024-07-05 ENCOUNTER — Ambulatory Visit: Payer: Self-pay | Admitting: Family Medicine

## 2024-07-05 LAB — COMPREHENSIVE METABOLIC PANEL WITH GFR
ALT: 19 IU/L (ref 0–32)
AST: 26 IU/L (ref 0–40)
Albumin: 5 g/dL (ref 4.0–5.0)
Alkaline Phosphatase: 78 IU/L (ref 41–116)
BUN/Creatinine Ratio: 15 (ref 9–23)
BUN: 12 mg/dL (ref 6–20)
Bilirubin Total: 0.6 mg/dL (ref 0.0–1.2)
CO2: 20 mmol/L (ref 20–29)
Calcium: 10.3 mg/dL — ABNORMAL HIGH (ref 8.7–10.2)
Chloride: 106 mmol/L (ref 96–106)
Creatinine, Ser: 0.8 mg/dL (ref 0.57–1.00)
Globulin, Total: 2.2 g/dL (ref 1.5–4.5)
Glucose: 104 mg/dL — ABNORMAL HIGH (ref 70–99)
Potassium: 5 mmol/L (ref 3.5–5.2)
Sodium: 144 mmol/L (ref 134–144)
Total Protein: 7.2 g/dL (ref 6.0–8.5)
eGFR: 102 mL/min/1.73 (ref >59)

## 2024-07-05 LAB — CBC
Hematocrit: 44.4 % (ref 34.0–46.6)
Hemoglobin: 14.5 g/dL (ref 11.1–15.9)
MCH: 31 pg (ref 26.6–33.0)
MCHC: 32.7 g/dL (ref 31.5–35.7)
MCV: 95 fL (ref 79–97)
Platelets: 253 x10E3/uL (ref 150–450)
RBC: 4.67 x10E6/uL (ref 3.77–5.28)
RDW: 11.5 % — ABNORMAL LOW (ref 11.7–15.4)
WBC: 6.6 x10E3/uL (ref 3.4–10.8)

## 2024-07-05 LAB — CYTOLOGY - PAP
Comment: NEGATIVE
Diagnosis: NEGATIVE
High risk HPV: NEGATIVE

## 2024-07-05 LAB — IRON,TIBC AND FERRITIN PANEL
Ferritin: 106 ng/mL (ref 15–150)
Iron Saturation: 41 % (ref 15–55)
Iron: 119 ug/dL (ref 27–159)
Total Iron Binding Capacity: 289 ug/dL (ref 250–450)
UIBC: 170 ug/dL (ref 131–425)

## 2024-07-05 LAB — TSH RFX ON ABNORMAL TO FREE T4: TSH: 0.865 u[IU]/mL (ref 0.450–4.500)

## 2024-07-09 ENCOUNTER — Other Ambulatory Visit (HOSPITAL_BASED_OUTPATIENT_CLINIC_OR_DEPARTMENT_OTHER): Payer: Self-pay

## 2024-07-09 ENCOUNTER — Ambulatory Visit: Admitting: Internal Medicine

## 2024-07-09 ENCOUNTER — Encounter: Payer: Self-pay | Admitting: Internal Medicine

## 2024-07-09 ENCOUNTER — Other Ambulatory Visit: Payer: Self-pay

## 2024-07-09 VITALS — BP 114/74 | HR 74 | Temp 98.3°F | Resp 18 | Ht 70.0 in | Wt 228.7 lb

## 2024-07-09 DIAGNOSIS — J3089 Other allergic rhinitis: Secondary | ICD-10-CM | POA: Diagnosis not present

## 2024-07-09 DIAGNOSIS — E739 Lactose intolerance, unspecified: Secondary | ICD-10-CM | POA: Diagnosis not present

## 2024-07-09 DIAGNOSIS — L858 Other specified epidermal thickening: Secondary | ICD-10-CM

## 2024-07-09 DIAGNOSIS — K5289 Other specified noninfective gastroenteritis and colitis: Secondary | ICD-10-CM

## 2024-07-09 DIAGNOSIS — T7800XA Anaphylactic reaction due to unspecified food, initial encounter: Secondary | ICD-10-CM

## 2024-07-09 DIAGNOSIS — T7800XD Anaphylactic reaction due to unspecified food, subsequent encounter: Secondary | ICD-10-CM

## 2024-07-09 DIAGNOSIS — H1013 Acute atopic conjunctivitis, bilateral: Secondary | ICD-10-CM

## 2024-07-09 DIAGNOSIS — L500 Allergic urticaria: Secondary | ICD-10-CM

## 2024-07-09 DIAGNOSIS — J302 Other seasonal allergic rhinitis: Secondary | ICD-10-CM

## 2024-07-09 MED ORDER — NEFFY 2 MG/0.1ML NA SOLN
1.0000 | NASAL | 1 refills | Status: AC | PRN
  Filled 2024-07-09: qty 2, 1d supply, fill #0

## 2024-07-09 MED ORDER — LACTAID 3000 UNITS PO TABS
3000.0000 [IU] | ORAL_TABLET | Freq: Three times a day (TID) | ORAL | 3 refills | Status: DC
  Filled 2024-07-09: qty 120, 40d supply, fill #0

## 2024-07-09 MED ORDER — FAMOTIDINE 20 MG PO TABS
20.0000 mg | ORAL_TABLET | Freq: Two times a day (BID) | ORAL | 5 refills | Status: AC
  Filled 2024-07-09: qty 60, 30d supply, fill #0

## 2024-07-09 NOTE — Patient Instructions (Addendum)
 Seasonal and Perennial Allergic: - allergy  testing 05/10/23: positive to grass, weed and tree pollen, indoor mold (penicillium-not penicillin; okay to take penicillins if needed) - intradermals positive to cat, dog and cockroach - Prevention:  - allergen avoidance when possible - consider allergy  shots as long term control of your symptoms by teaching your immune system to be more tolerant of your allergy  triggers - Symptom control: - Continue Dymista  1 spray in each nostril twice daily as needed - Continue Zyrtec  (Cetirizine ) 10mg  daily as needed.  - continue atrovent   (ipatroipum) 1-2 spray twice daily as needed for drainage and can use less frequently if becomes too dry  Allergic Conjunctivitis:  - Continue Allergy  Eye drops-great option includes Zaditor (ketotifen) for eye symptoms daily as needed-can purchase over the counter -Avoid eye drops that say red eye relief as they may contain medications that dry out your eyes.  Food allergy :  - 05/10/23 skin testing was positive to peanut , scallops, oyster, pecan, and pistachio - please strictly avoid peanuts, tree nuts, shellfish, kiwi - for SKIN only reaction, okay to take Benadryl  2 capsules every 4 hours - for SKIN + ANY additional symptoms, OR IF concern for LIFE THREATENING reaction = Epipen  Autoinjector EpiPen  0.3 mg. - If using Epinephrine  autoinjector, call 911 - A food allergy  action plan has been provided and discussed. - Medic Alert identification is recommended.  Lactose Intolerance- Milk allergy  testing 05/10/23 is negative - this is not an allergy  but rather a problem with breaking down dairy (due to an enzyme deficiency) which can lead to bloating, gas, diarrhea, nausea and vomiting - choose lactose free dairy or take lactaid prior to eating dairy products that contain lactose  Rash: Keratosis Pilaris:  - this is a benign condition - use an over-the-counter lotion containing lactic acid or ammonium lactate (such as  LacHydrin or CeraVe S.A.) up to twice daily on bumpy skin - sunscreen using an SPF of 30 or greater is highly encouraged  Mastocytic enterocolitis?  GERD? Recent colon biopsy with 20 right colon and 50 left colon of mast cells/HPF without other abnormal findings -labs today: tryptase and ckit - start pepcid  20 mg twice daily as needed for stomach pain Follow-up with GI  Follow up : 6 months, sooner if needed It was a pleasure seeing you again in clinic today! Thank you for allowing me to participate in your care.  Rocky Endow, MD [facility]  Reducing Pollen Exposure  The American Academy of Allergy , Asthma and Immunology suggests the following steps to reduce your exposure to pollen during allergy  seasons.    Do not hang sheets or clothing out to dry; pollen may collect on these items. Do not mow lawns or spend time around freshly cut grass; mowing stirs up pollen. Keep windows closed at night.  Keep car windows closed while driving. Minimize morning activities outdoors, a time when pollen counts are usually at their highest. Stay indoors as much as possible when pollen counts or humidity is high and on windy days when pollen tends to remain in the air longer. Use air conditioning when possible.  Many air conditioners have filters that trap the pollen spores. Use a HEPA room air filter to remove pollen form the indoor air you breathe. Control of Mold Allergen   Mold and fungi can grow on a variety of surfaces provided certain temperature and moisture conditions exist.  Outdoor molds grow on plants, decaying vegetation and soil.  The major outdoor mold,  Alternaria and Cladosporium, are found in very high numbers during hot and dry conditions.  Generally, a late Summer - Fall peak is seen for common outdoor fungal spores.  Rain will temporarily lower outdoor mold spore count, but counts rise rapidly when the rainy period ends.  The most important indoor molds are  Aspergillus and Penicillium.  Dark, humid and poorly ventilated basements are ideal sites for mold growth.  The next most common sites of mold growth are the bathroom and the kitchen.  Outdoor (Seasonal) Mold Control  Use air conditioning and keep windows closed Avoid exposure to decaying vegetation. Avoid leaf raking. Avoid grain handling. Consider wearing a face mask if working in moldy areas.    Indoor (Perennial) Mold Control   Maintain humidity below 50%. Clean washable surfaces with 5% bleach solution. Remove sources e.g. contaminated carpets.  Control of Dog or Cat Allergen  Avoidance is the best way to manage a dog or cat allergy . If you have a dog or cat and are allergic to dog or cats, consider removing the dog or cat from the home. If you have a dog or cat but don't want to find it a new home, or if your family wants a pet even though someone in the household is allergic, here are some strategies that may help keep symptoms at bay:  Keep the pet out of your bedroom and restrict it to only a few rooms. Be advised that keeping the dog or cat in only one room will not limit the allergens to that room. Don't pet, hug or kiss the dog or cat; if you do, wash your hands with soap and water. High-efficiency particulate air (HEPA) cleaners run continuously in a bedroom or living room can reduce allergen levels over time. Regular use of a high-efficiency vacuum cleaner or a central vacuum can reduce allergen levels. Giving your dog or cat a bath at least once a week can reduce airborne allergen. Control of Cockroach Allergen  Cockroach allergen has been identified as an important cause of acute attacks of asthma, especially in urban settings.  There are fifty-five species of cockroach that exist in the [place] , however only three, the [place], [place] and [place] species produce allergen that can affect patients with Asthma.  Allergens can be obtained from fecal particles, egg  casings and secretions from cockroaches.    Remove food sources. Reduce access to water. Seal access and entry points. Spray runways with 0.5-1% Diazinon or Chlorpyrifos Blow boric acid power under stoves and refrigerator. Place bait stations (hydramethylnon) at feeding sites.

## 2024-07-09 NOTE — Progress Notes (Signed)
 FOLLOW UP Date of Service/Encounter:   07/09/2024  Subjective:  Emily Villanueva (DOB: [birth year]) is a 28 y.o. female [facility] in re-evaluation of the following: allergic rhinitis, food allergies and keratosis pilaris  History obtained from: chart review and patient.  For Review, LV was on 01/31/24  with Dr.Lottie Sigman seen for routine follow-up. See below for summary of history and diagnostics.   Therapeutic plans/changes recommended: reported recent reaction to shirmp including facial redness, difficulty breathing and loss of voice. However, symptoms lasted for several days. She was evaluated at [facility] and treated with epinephrine  and given oral prednisone . Daughter also sick with sore throat at the time.  FEV1 82% at that visit.  Unclear if symptoms were due to shellfish allergy  or concurrent viral illness. Symptoms of cough, sore throat, and general malaise. Likely viral in nature given the context. ----------------------------------------------------- Pertinent History/Diagnostics:  Allergic Rhinitis:  nasal congestion, rhinorrhea, watery eyes, and itchy eyes,post nasal drip, sneezing attacks Occurs year-round - SPT environmental panel (05/10/23):  positive to grass, weed and tree pollen, indoor mold (penicillium-not penicillin; okay to take penicillins) - intradermals positive to cat, dog and cockroach Food Allergy :  Kiwi: feels like acid swishing in her mouth Dairy: extreme abdominal pain, but tolerates lactose free dairy. Almond: throat itchy/scratchy, hoarseness, told her throat was closing at an UC Peanuts: ate reese's cup years ago and felt like her throat was closing. Shellfish: rash, no other symptoms except maybe abdominal pain. Avoids fish due to reaction with shellfish. She does report a recent exposure to tree nuts in an ice cream and developed immediate throat and mouth itching. Resolved with benadryl  PRN.  Reviewed previous  allergy  appointment with Dr. Meliton ([facility]): seen 2021 for concern for food allergies-reported upset stomach and rashes, food intolerance suspected and labs ordered  05/12/20: sIgE: positive to walnut 0.74, pistachio 0.82, pecan 0.11, almonds 0.81, [place] nut 0.39, hazelnut 0.69, peanut  0.80, cashew < 0.10 Shellfish clams 0.36, crab 0.45, lobster 0.26, oyster 0.29 shrimp 0.20, scallop Patient told testing was negative.  - 05/10/23 skin testing was positive to peanut , scallops, oyster, pecan, and pistachio - labs 05/10/23: low level positive to shellfish-challenge offered, positive to peanuts and tree nuts, negative to fish. Advised to avoid nuts, shellfish, okay to introduce fish if desire. --------------------------------------------------- Today presents for follow-up. Discussed the use of AI scribe software for clinical note transcription with the patient, who gave verbal consent to proceed.  History of Present Illness Emily Villanueva is a 28 year old female who presents with increased mast cells in the colon and associated gastrointestinal symptoms. She was referred by her gastroenterologist for evaluation of possible mast cell activation syndrome.  Gastrointestinal symptoms and mast cell findings - Colonoscopy revealed increased mast cells in the colon, with counts of approximately thirty on right and fifty on left. - Persistent stomach and chest pain, particularly with dairy ingestion, even when using dairy-free creamer (which does not contain dairy at all) - Pain and nausea have persisted for approximately one week despite dietary modifications and increased exercise. - No diarrhea or vomiting, but significant nausea is present. - Bloating and cramping occur with accidental dairy ingestion, attributed to lactose intolerance (negative dairy allergy  testing). - She does not wish to return to GI as states they told her they will see her when she is much older.  Dietary modifications and  food intolerances - Eliminated gluten, dairy, and sugar from diet. - Previously consumed large amounts of  coffee, now only tolerates a small cup due to stomach upset. - not taking anything for reflux - no symptoms concerning for severe reaction or food allergy ; avoiding foods identified as causing allergy -nuts, shellfish, kiwi  Allergic rhinitis - History of nasal allergies. - Currently taking Zyrtec  daily. - Not using a nasal spray at this time; symptoms controlled on Zyrtec .  Chart Review: Reviewed GI biopsy which was stained for CKIT and showed slightly elevated mast cells in colon (30 and 50/HPF on right and left colon)  All medications reviewed by clinical staff and updated in chart. No new pertinent medical or surgical history except as noted in HPI.  ROS: All others negative except as noted per HPI.   Objective:  BP 114/74 (BP Location: Right Arm, Patient Position: Sitting, Cuff Size: Normal)   Pulse 74   Temp 98.3 F (36.8 C) (Oral)   Resp 18   Ht 5' 10 (1.778 m)   Wt 228 lb 11.2 oz (103.7 kg)   SpO2 98%   BMI 32.82 kg/m  Body mass index is 32.82 kg/m. Physical Exam: General Appearance:  Alert, cooperative, no distress, appears stated age  Head:  Normocephalic, without obvious abnormality, atraumatic  Eyes:  Conjunctiva clear, EOM's intact  Ears EACs normal bilaterally and normal TMs bilaterally  Nose: Nares normal, hypertrophic turbinates, normal mucosa, and no visible anterior polyps  Throat: Lips, tongue normal; teeth and gums normal, normal posterior oropharynx  Neck: Supple, symmetrical  Lungs:   clear to auscultation bilaterally, Respirations unlabored, no coughing  Heart:  regular rate and rhythm and no murmur, Appears well perfused  Extremities: No edema  Skin: Skin color, texture, turgor normal and no rashes or lesions on visualized portions of skin  Neurologic: No gross deficits   Labs:  Lab Orders         Tryptase    C-KIT blood  Assessment/Plan    Seasonal and Perennial Allergic: at goal - allergy  testing 05/10/23: positive to grass, weed and tree pollen, indoor mold (penicillium-not penicillin; okay to take penicillins if needed) - intradermals positive to cat, dog and cockroach - Prevention:  - allergen avoidance when possible - consider allergy  shots as long term control of your symptoms by teaching your immune system to be more tolerant of your allergy  triggers - Symptom control: - Continue Dymista  1 spray in each nostril twice daily as needed - Continue Zyrtec  (Cetirizine ) 10mg  daily as needed.  - continue atrovent   (ipatroipum) 1-2 spray twice daily as needed for drainage and can use less frequently if becomes too dry  Allergic Conjunctivitis: at goal - Continue Allergy  Eye drops-great option includes Zaditor (ketotifen) for eye symptoms daily as needed-can purchase over the counter -Avoid eye drops that say red eye relief as they may contain medications that dry out your eyes.  Food allergy : at goal - 05/10/23 skin testing was positive to peanut , scallops, oyster, pecan, and pistachio - please strictly avoid peanuts, tree nuts, shellfish, kiwi - for SKIN only reaction, okay to take Benadryl  2 capsules every 4 hours - for SKIN + ANY additional symptoms, OR IF concern for LIFE THREATENING reaction = Epipen  Autoinjector EpiPen  0.3 mg. - If using Epinephrine  autoinjector, call 911 - A food allergy  action plan has been provided and discussed. - Medic Alert identification is recommended. - will send in [place]  - disucssed nasal epinephrine  vs injectable and she prefers nasal  Lactose Intolerance-not controlled, reviewed difference between intolerance vs allergy ; there continues to be some confusion on part of  patient.  Milk allergy  testing 05/10/23 is negative - this is not an allergy  but rather a problem with breaking down dairy (due to an enzyme deficiency) which can lead to bloating, gas, diarrhea, nausea and vomiting -  choose lactose free dairy or take lactaid prior to eating dairy products that contain lactose  Rash: Keratosis Pilaris: stable - this is a benign condition - use an over-the-counter lotion containing lactic acid or ammonium lactate (such as LacHydrin or CeraVe S.A.) up to twice daily on bumpy skin - sunscreen using an SPF of 30 or greater is highly encouraged  Mastocytic enterocolitis?  GERD? Recent colon biopsy with 20 right colon and 50 left colon of mast cells/HPF without other abnormal findings -labs today: tryptase and ckit - start pepcid  20 mg twice daily as needed for stomach pain Follow-up with GI  Follow up : 6 months, sooner if needed It was a pleasure seeing you again in clinic today! Thank you for allowing me to participate in your care.  Rocky Endow, MD [facility] [place]  Other: none  Rocky Endow, MD  [facility] [place] 

## 2024-07-11 LAB — TRYPTASE: Tryptase: 4 ug/L (ref 2.2–13.2)

## 2024-07-12 ENCOUNTER — Other Ambulatory Visit (HOSPITAL_BASED_OUTPATIENT_CLINIC_OR_DEPARTMENT_OTHER): Payer: Self-pay

## 2024-07-12 ENCOUNTER — Other Ambulatory Visit: Payer: Self-pay

## 2024-07-12 MED ORDER — AMOXICILLIN-POT CLAVULANATE 500-125 MG PO TABS
1.0000 | ORAL_TABLET | Freq: Two times a day (BID) | ORAL | 0 refills | Status: AC
  Filled 2024-07-12: qty 14, 7d supply, fill #0

## 2024-07-12 MED ORDER — HYDROCODONE-ACETAMINOPHEN 5-325 MG PO TABS
1.0000 | ORAL_TABLET | Freq: Four times a day (QID) | ORAL | 0 refills | Status: AC | PRN
  Filled 2024-07-12: qty 20, 5d supply, fill #0

## 2024-07-12 MED ORDER — ONDANSETRON 4 MG PO TBDP
4.0000 mg | ORAL_TABLET | Freq: Three times a day (TID) | ORAL | 0 refills | Status: AC | PRN
  Filled 2024-07-12: qty 21, 7d supply, fill #0

## 2024-07-16 LAB — C-KIT MUTATION, LIQUID TUMOR

## 2024-07-17 ENCOUNTER — Other Ambulatory Visit (HOSPITAL_BASED_OUTPATIENT_CLINIC_OR_DEPARTMENT_OTHER): Payer: Self-pay

## 2024-07-17 ENCOUNTER — Ambulatory Visit: Payer: Self-pay | Admitting: Internal Medicine

## 2024-07-17 MED ORDER — IBUPROFEN 800 MG PO TABS
800.0000 mg | ORAL_TABLET | Freq: Three times a day (TID) | ORAL | 0 refills | Status: AC | PRN
  Filled 2024-07-17: qty 21, 7d supply, fill #0

## 2024-07-17 MED ORDER — AMOXICILLIN 500 MG PO CAPS
500.0000 mg | ORAL_CAPSULE | Freq: Three times a day (TID) | ORAL | 0 refills | Status: AC
  Filled 2024-07-17: qty 21, 7d supply, fill #0

## 2024-07-17 NOTE — Progress Notes (Signed)
 Please let Emily Villanueva know that her tryptase and c-Kit labs have returned. These help look for mast cell diseases which were not detected with these labs.  If she has another episode concerning for allergies, would recommend her getting a tryptase at that time. For her abdominal issues, would recommend she follow-up with gastroenterology. Let me know if she has any questions.

## 2024-07-18 NOTE — Progress Notes (Signed)
 My chart message sent

## 2024-07-26 ENCOUNTER — Other Ambulatory Visit (HOSPITAL_BASED_OUTPATIENT_CLINIC_OR_DEPARTMENT_OTHER): Payer: Self-pay

## 2024-07-26 MED ORDER — FLUCONAZOLE 150 MG PO TABS
ORAL_TABLET | ORAL | 0 refills | Status: AC
  Filled 2024-07-26: qty 2, 7d supply, fill #0

## 2024-07-26 MED ORDER — OXYMETAZOLINE HCL 0.05 % NA SOLN
NASAL | 0 refills | Status: AC
  Filled 2024-07-26: qty 30, 30d supply, fill #0

## 2024-08-01 ENCOUNTER — Ambulatory Visit: Admitting: Family Medicine

## 2024-08-01 ENCOUNTER — Ambulatory Visit: Payer: Self-pay

## 2024-08-01 VITALS — BP 102/64 | HR 79 | Ht 70.0 in | Wt 228.0 lb

## 2024-08-01 DIAGNOSIS — L989 Disorder of the skin and subcutaneous tissue, unspecified: Secondary | ICD-10-CM | POA: Diagnosis not present

## 2024-08-01 DIAGNOSIS — Z30431 Encounter for routine checking of intrauterine contraceptive device: Secondary | ICD-10-CM

## 2024-08-01 NOTE — Progress Notes (Signed)
   Subjective:   Patient Name: Emily Villanueva, female   DOB: April 20, 1996, 28 y.o.  MRN: [id number]  HPI Patient here for an IUD check.  She had the Mirena  IUD placed 1 month ago.  She reports no problems.  She does have an area of concern on her left breast. Initially looked like a popped blood vessle under the skin  Review of Systems  Constitutional: Negative for fever and chills.  Gastrointestinal: Negative for abdominal pain.  Genitourinary: Negative for vaginal discharge, vaginal pain, pelvic pain and dyspareunia.        Objective:  BP 102/64   Pulse 79   Ht 5' 10 (1.778 m)   Wt 228 lb (103.4 kg)   LMP 07/20/2024 (Approximate)   BMI 32.71 kg/m  Chaperone present   Physical Exam  Constitutional: She appears well-developed and well-nourished.  HENT:  Head: Normocephalic and atraumatic.  Breast: 4mm area on left upper outer breast. Smooth borders. Nonraised. Abdominal: Soft. There is no tenderness. There is no guarding.  Genitourinary: There is no rash, tenderness or lesion on the right labia. There is no rash, tenderness or lesion on the left labia. No erythema or tenderness in the vagina. No foreign body around the vagina. No signs of injury around the vagina. No vaginal discharge found.    Skin: Skin is warm and dry.  Psychiatric: She has a normal mood and affect. Her behavior is normal. Judgment and thought content normal.       Assessment & Plan:  1. IUD check up IUD in place.  Pt to call with any other problems.  Recheck in 1 year.  2. Skin lesion Appears benign. Will watch over the next couple of months. If continues to change, then will biopsy.

## 2024-10-02 ENCOUNTER — Encounter: Payer: Self-pay | Admitting: *Deleted

## 2025-01-08 ENCOUNTER — Ambulatory Visit: Admitting: Internal Medicine
